# Patient Record
Sex: Female | Born: 1980 | Race: Black or African American | Hispanic: No | Marital: Single | Smoking: Never smoker
Health system: Southern US, Community
[De-identification: ages and names within clinical notes are randomized; demographics above are authoritative.]

## PROBLEM LIST (undated history)

## (undated) DIAGNOSIS — D649 Anemia, unspecified: Secondary | ICD-10-CM

## (undated) DIAGNOSIS — F4310 Post-traumatic stress disorder, unspecified: Secondary | ICD-10-CM

## (undated) DIAGNOSIS — K859 Acute pancreatitis without necrosis or infection, unspecified: Secondary | ICD-10-CM

## (undated) DIAGNOSIS — F419 Anxiety disorder, unspecified: Secondary | ICD-10-CM

## (undated) DIAGNOSIS — F329 Major depressive disorder, single episode, unspecified: Secondary | ICD-10-CM

## (undated) DIAGNOSIS — R87629 Unspecified abnormal cytological findings in specimens from vagina: Secondary | ICD-10-CM

## (undated) DIAGNOSIS — F32A Depression, unspecified: Secondary | ICD-10-CM

## (undated) DIAGNOSIS — R87619 Unspecified abnormal cytological findings in specimens from cervix uteri: Secondary | ICD-10-CM

## (undated) DIAGNOSIS — IMO0002 Reserved for concepts with insufficient information to code with codable children: Secondary | ICD-10-CM

## (undated) HISTORY — DX: Unspecified abnormal cytological findings in specimens from vagina: R87.629

## (undated) HISTORY — DX: Post-traumatic stress disorder, unspecified: F43.10

## (undated) HISTORY — PX: TONSILLECTOMY: SUR1361

## (undated) HISTORY — PX: CHOLECYSTECTOMY: SHX55

---
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
## 2007-04-12 ENCOUNTER — Emergency Department (HOSPITAL_COMMUNITY): Admission: EM | Admit: 2007-04-12 | Discharge: 2007-04-13 | Payer: Self-pay | Admitting: Emergency Medicine

## 2008-07-18 ENCOUNTER — Ambulatory Visit: Payer: Self-pay | Admitting: Physician Assistant

## 2008-07-18 ENCOUNTER — Inpatient Hospital Stay (HOSPITAL_COMMUNITY): Admission: AD | Admit: 2008-07-18 | Discharge: 2008-07-18 | Payer: Self-pay | Admitting: Obstetrics & Gynecology

## 2008-08-27 ENCOUNTER — Inpatient Hospital Stay (HOSPITAL_COMMUNITY): Admission: AD | Admit: 2008-08-27 | Discharge: 2008-08-27 | Payer: Self-pay | Admitting: Obstetrics & Gynecology

## 2008-08-27 ENCOUNTER — Ambulatory Visit: Payer: Self-pay | Admitting: Obstetrics and Gynecology

## 2008-09-28 ENCOUNTER — Inpatient Hospital Stay (HOSPITAL_COMMUNITY): Admission: AD | Admit: 2008-09-28 | Discharge: 2008-09-28 | Payer: Self-pay | Admitting: Obstetrics & Gynecology

## 2008-11-11 ENCOUNTER — Inpatient Hospital Stay (HOSPITAL_COMMUNITY): Admission: AD | Admit: 2008-11-11 | Discharge: 2008-11-11 | Payer: Self-pay | Admitting: Obstetrics & Gynecology

## 2008-11-11 ENCOUNTER — Ambulatory Visit: Payer: Self-pay | Admitting: Obstetrics and Gynecology

## 2008-12-07 ENCOUNTER — Ambulatory Visit: Payer: Self-pay | Admitting: Obstetrics & Gynecology

## 2008-12-07 ENCOUNTER — Encounter: Payer: Self-pay | Admitting: Obstetrics & Gynecology

## 2008-12-07 LAB — CONVERTED CEMR LAB
MCHC: 32.4 g/dL (ref 30.0–36.0)
Platelets: 307 10*3/uL (ref 150–400)
RDW: 13.9 % (ref 11.5–15.5)

## 2008-12-22 ENCOUNTER — Ambulatory Visit: Payer: Self-pay | Admitting: Obstetrics & Gynecology

## 2008-12-24 ENCOUNTER — Inpatient Hospital Stay (HOSPITAL_COMMUNITY): Admission: AD | Admit: 2008-12-24 | Discharge: 2008-12-24 | Payer: Self-pay | Admitting: Obstetrics and Gynecology

## 2009-01-16 ENCOUNTER — Inpatient Hospital Stay (HOSPITAL_COMMUNITY): Admission: AD | Admit: 2009-01-16 | Discharge: 2009-01-16 | Payer: Self-pay | Admitting: Obstetrics & Gynecology

## 2009-01-19 ENCOUNTER — Ambulatory Visit: Payer: Self-pay | Admitting: Obstetrics & Gynecology

## 2009-02-02 ENCOUNTER — Ambulatory Visit: Payer: Self-pay | Admitting: Obstetrics & Gynecology

## 2009-02-02 LAB — CONVERTED CEMR LAB: Chlamydia, DNA Probe: NEGATIVE

## 2009-02-03 ENCOUNTER — Encounter: Payer: Self-pay | Admitting: Obstetrics & Gynecology

## 2009-02-03 LAB — CONVERTED CEMR LAB: Trich, Wet Prep: NONE SEEN

## 2009-02-08 ENCOUNTER — Inpatient Hospital Stay (HOSPITAL_COMMUNITY): Admission: AD | Admit: 2009-02-08 | Discharge: 2009-02-08 | Payer: Self-pay | Admitting: Obstetrics & Gynecology

## 2009-02-08 ENCOUNTER — Ambulatory Visit: Payer: Self-pay | Admitting: Obstetrics and Gynecology

## 2009-02-16 ENCOUNTER — Ambulatory Visit: Payer: Self-pay | Admitting: Obstetrics & Gynecology

## 2009-02-16 LAB — CONVERTED CEMR LAB
RDW: 14.9 % (ref 11.5–15.5)
WBC: 7.7 10*3/uL (ref 4.0–10.5)

## 2009-02-19 ENCOUNTER — Inpatient Hospital Stay (HOSPITAL_COMMUNITY): Admission: AD | Admit: 2009-02-19 | Discharge: 2009-02-19 | Payer: Self-pay | Admitting: Obstetrics and Gynecology

## 2009-02-19 ENCOUNTER — Ambulatory Visit: Payer: Self-pay | Admitting: Obstetrics and Gynecology

## 2009-02-21 ENCOUNTER — Ambulatory Visit (HOSPITAL_COMMUNITY): Admission: RE | Admit: 2009-02-21 | Discharge: 2009-02-21 | Payer: Self-pay | Admitting: Family Medicine

## 2009-02-23 ENCOUNTER — Encounter: Payer: Self-pay | Admitting: Obstetrics & Gynecology

## 2009-02-23 ENCOUNTER — Ambulatory Visit: Payer: Self-pay | Admitting: Obstetrics & Gynecology

## 2009-03-02 ENCOUNTER — Ambulatory Visit: Payer: Self-pay | Admitting: Family Medicine

## 2009-03-06 ENCOUNTER — Ambulatory Visit: Payer: Self-pay | Admitting: Obstetrics & Gynecology

## 2009-03-07 ENCOUNTER — Inpatient Hospital Stay (HOSPITAL_COMMUNITY): Admission: AD | Admit: 2009-03-07 | Discharge: 2009-03-09 | Payer: Self-pay | Admitting: Obstetrics and Gynecology

## 2009-03-07 ENCOUNTER — Ambulatory Visit: Payer: Self-pay | Admitting: Advanced Practice Midwife

## 2009-06-19 ENCOUNTER — Inpatient Hospital Stay (HOSPITAL_COMMUNITY): Admission: AD | Admit: 2009-06-19 | Discharge: 2009-06-19 | Payer: Self-pay | Admitting: Obstetrics and Gynecology

## 2009-08-21 ENCOUNTER — Emergency Department (HOSPITAL_COMMUNITY): Admission: EM | Admit: 2009-08-21 | Discharge: 2009-08-21 | Payer: Self-pay | Admitting: Emergency Medicine

## 2009-08-23 ENCOUNTER — Emergency Department (HOSPITAL_COMMUNITY): Admission: EM | Admit: 2009-08-23 | Discharge: 2009-08-23 | Payer: Self-pay | Admitting: Emergency Medicine

## 2009-09-07 ENCOUNTER — Emergency Department (HOSPITAL_COMMUNITY): Admission: EM | Admit: 2009-09-07 | Discharge: 2009-09-08 | Payer: Self-pay | Admitting: Emergency Medicine

## 2009-09-11 ENCOUNTER — Ambulatory Visit: Payer: Self-pay | Admitting: Gastroenterology

## 2009-09-11 ENCOUNTER — Encounter (INDEPENDENT_AMBULATORY_CARE_PROVIDER_SITE_OTHER): Payer: Self-pay | Admitting: General Surgery

## 2009-09-11 ENCOUNTER — Observation Stay (HOSPITAL_COMMUNITY): Admission: EM | Admit: 2009-09-11 | Discharge: 2009-09-15 | Payer: Self-pay | Admitting: Emergency Medicine

## 2009-09-12 ENCOUNTER — Encounter: Payer: Self-pay | Admitting: Gastroenterology

## 2010-03-23 ENCOUNTER — Ambulatory Visit (HOSPITAL_COMMUNITY): Admission: RE | Admit: 2010-03-23 | Discharge: 2010-03-23 | Payer: Self-pay | Admitting: Psychiatry

## 2010-07-10 NOTE — Procedures (Signed)
Summary: ERCP  Patient: Diana Espinoza Note: All result statuses are Final unless otherwise noted.  Tests: (1) ERCP (ERC)   ERC ERCP                  DONE (C)     [facility]     [address] [place], [place]  [address]           ERCP PROCEDURE REPORT           PATIENT:  Diana, Espinoza  MR#:  [id number]     BIRTHDATE:  09/05/1980  GENDER:  female           ENDOSCOPIST:  Judie Petit T. Russella Dar, MD, [facility]           PROCEDURE DATE:  09/12/2009     PROCEDURE:  ERCP with sphincterotomy, and with removal of stones           INDICATIONS:  suspected stone, abnormal Liver Function Tests,     abnormal imaging-filling defect on IOC.           MEDICATIONS:  Fentanyl 125 mcg IV, Versed 12.5 mg IV, glucagon 1     mg IV     TOPICAL ANESTHETIC:  Cetacaine Spray           DESCRIPTION OF PROCEDURE:   After the risks benefits and     alternatives of the procedure were thoroughly explained, informed     consent was obtained.  The Pentax ED-3430T endoscope was     introduced through the mouth and advanced to the second portion of     the duodenum.           A filling defect was noted in the distal common bile duct. It was     3 - 4 mm in size. A stone extraction balloon was used to sweep the     duct and extract the stone/filling defect. No stone was noted     after 3 pull throughs and no filling defect noted on the last     occlusion cholangiogram. There was good bililary drainage noted.     The intrahepatic and extrahepatic bile ducts were otherwise     normal. Prior cholecystectomy. A normal appearing ampulla was     visualized. There was no evidence of papillitis or any trauma to     the ampulla. in the descending duodenum. Sphincterotomy was     performed with a regular 30 mm pappillotome using guidewire     technique. The scope was then completely withdrawn from the     patient and the procedure completed.     <<PROCEDUREIMAGES>>           COMPLICATIONS:  None        ENDOSCOPIC IMPRESSION:     1) 3 - 4 mm filling defect in the common bile duct     2) Otherwise normal biliary tree     3) Normal ampulla     4) Prior cholecystectomy           RECOMMENDATIONS:     1) liver enzymes in 4 weeks     2) follow-up: GI clinic PRN           Diedra Sinor T. Russella Dar, MD, [facility]           CC:  Claud Kelp, M.D.           n.     REVISED:  09/12/2009 10:27 AM  eSIGNED:   Clorine Swing T. Damyn Weitzel at 09/12/2009 10:27 AM           Damita Dunnings, [id number]  Note: An exclamation mark (!) indicates a result that was not dispersed into the flowsheet. Document Creation Date: 09/12/2009 10:28 AM _______________________________________________________________________  (1) Order result status: Final Collection or observation date-time: 09/12/2009 10:08 Requested date-time:  Receipt date-time:  Reported date-time:  Referring Physician:   Ordering Physician: Claudette Head [phone]) Specimen Source:  Source: Launa Grill Order Number: [id number] Lab site:

## 2010-08-26 LAB — URINALYSIS, ROUTINE W REFLEX MICROSCOPIC
Bilirubin Urine: NEGATIVE
Ketones, ur: NEGATIVE mg/dL
Leukocytes, UA: NEGATIVE
Nitrite: NEGATIVE
Protein, ur: NEGATIVE mg/dL
Specific Gravity, Urine: 1.025 (ref 1.005–1.030)

## 2010-08-26 LAB — URINE MICROSCOPIC-ADD ON

## 2010-08-29 LAB — COMPREHENSIVE METABOLIC PANEL
ALT: 62 U/L — ABNORMAL HIGH (ref 0–35)
ALT: 64 U/L — ABNORMAL HIGH (ref 0–35)
AST: 20 U/L (ref 0–37)
AST: 35 U/L (ref 0–37)
Albumin: 2.8 g/dL — ABNORMAL LOW (ref 3.5–5.2)
Albumin: 3.2 g/dL — ABNORMAL LOW (ref 3.5–5.2)
Albumin: 3.7 g/dL (ref 3.5–5.2)
Alkaline Phosphatase: 57 U/L (ref 39–117)
Alkaline Phosphatase: 62 U/L (ref 39–117)
Alkaline Phosphatase: 65 U/L (ref 39–117)
BUN: 2 mg/dL — ABNORMAL LOW (ref 6–23)
BUN: 4 mg/dL — ABNORMAL LOW (ref 6–23)
CO2: 26 mEq/L (ref 19–32)
CO2: 27 mEq/L (ref 19–32)
Calcium: 8.3 mg/dL — ABNORMAL LOW (ref 8.4–10.5)
Calcium: 9.1 mg/dL (ref 8.4–10.5)
Chloride: 101 mEq/L (ref 96–112)
Chloride: 111 mEq/L (ref 96–112)
Creatinine, Ser: 0.68 mg/dL (ref 0.4–1.2)
Creatinine, Ser: 0.78 mg/dL (ref 0.4–1.2)
GFR calc Af Amer: >60 mL/min (ref >60)
GFR calc Af Amer: >60 mL/min (ref >60)
GFR calc non Af Amer: >60 mL/min (ref >60)
GFR calc non Af Amer: >60 mL/min (ref >60)
GFR calc non Af Amer: >60 mL/min (ref >60)
Glucose, Bld: 97 mg/dL (ref 70–99)
Glucose, Bld: 99 mg/dL (ref 70–99)
Potassium: 3.2 mEq/L — ABNORMAL LOW (ref 3.5–5.1)
Potassium: 3.3 mEq/L — ABNORMAL LOW (ref 3.5–5.1)
Potassium: 3.4 mEq/L — ABNORMAL LOW (ref 3.5–5.1)
Sodium: 135 mEq/L (ref 135–145)
Sodium: 142 mEq/L (ref 135–145)
Total Bilirubin: 0.4 mg/dL (ref 0.3–1.2)
Total Bilirubin: 0.7 mg/dL (ref 0.3–1.2)
Total Protein: 6.3 g/dL (ref 6.0–8.3)
Total Protein: 7.2 g/dL (ref 6.0–8.3)

## 2010-08-29 LAB — AMYLASE
Amylase: 3562 U/L — ABNORMAL HIGH (ref 0–105)
Amylase: 456 U/L — ABNORMAL HIGH (ref 0–105)

## 2010-08-29 LAB — POCT PREGNANCY, URINE: Preg Test, Ur: POSITIVE

## 2010-08-29 LAB — CBC
HCT: 32.8 % — ABNORMAL LOW (ref 36.0–46.0)
HCT: 33.9 % — ABNORMAL LOW (ref 36.0–46.0)
Hemoglobin: 10.7 g/dL — ABNORMAL LOW (ref 12.0–15.0)
Hemoglobin: 11.1 g/dL — ABNORMAL LOW (ref 12.0–15.0)
Hemoglobin: 9.3 g/dL — ABNORMAL LOW (ref 12.0–15.0)
MCHC: 33 g/dL (ref 30.0–36.0)
MCHC: 33.7 g/dL (ref 30.0–36.0)
MCHC: 34 g/dL (ref 30.0–36.0)
MCV: 91.3 fL (ref 78.0–100.0)
MCV: 91.6 fL (ref 78.0–100.0)
MCV: 91.8 fL (ref 78.0–100.0)
Platelets: 224 10*3/uL (ref 150–400)
Platelets: 314 10*3/uL (ref 150–400)
RBC: 3.03 MIL/uL — ABNORMAL LOW (ref 3.87–5.11)
RDW: 15.4 % (ref 11.5–15.5)
WBC: 5.4 10*3/uL (ref 4.0–10.5)
WBC: 6.9 10*3/uL (ref 4.0–10.5)

## 2010-08-29 LAB — BASIC METABOLIC PANEL
CO2: 28 mEq/L (ref 19–32)
Calcium: 8.3 mg/dL — ABNORMAL LOW (ref 8.4–10.5)
Chloride: 108 mEq/L (ref 96–112)
GFR calc Af Amer: >60 mL/min (ref >60)
Sodium: 139 mEq/L (ref 135–145)

## 2010-08-29 LAB — URINALYSIS, ROUTINE W REFLEX MICROSCOPIC
Leukocytes, UA: NEGATIVE
Protein, ur: NEGATIVE mg/dL
pH: 6 (ref 5.0–8.0)

## 2010-08-29 LAB — DIFFERENTIAL
Basophils Absolute: 0 10*3/uL (ref 0.0–0.1)
Monocytes Relative: 6 % (ref 3–12)

## 2010-08-29 LAB — URINE MICROSCOPIC-ADD ON

## 2010-08-29 LAB — LIPASE, BLOOD: Lipase: 26 U/L (ref 11–59)

## 2010-08-29 LAB — HCG, QUANTITATIVE, PREGNANCY: hCG, Beta Chain, Quant, S: 59 m[IU]/mL — ABNORMAL HIGH (ref <5)

## 2010-09-03 LAB — CBC
HCT: 38.1 % (ref 36.0–46.0)
Hemoglobin: 11.7 g/dL — ABNORMAL LOW (ref 12.0–15.0)
Hemoglobin: 12.1 g/dL (ref 12.0–15.0)
RBC: 3.92 MIL/uL (ref 3.87–5.11)
RBC: 4.2 MIL/uL (ref 3.87–5.11)
WBC: 4.7 10*3/uL (ref 4.0–10.5)
WBC: 6.2 10*3/uL (ref 4.0–10.5)

## 2010-09-03 LAB — URINALYSIS, ROUTINE W REFLEX MICROSCOPIC
Glucose, UA: NEGATIVE mg/dL
Ketones, ur: NEGATIVE mg/dL
Nitrite: NEGATIVE
Specific Gravity, Urine: 1.012 (ref 1.005–1.030)
pH: 6.5 (ref 5.0–8.0)

## 2010-09-03 LAB — DIFFERENTIAL
Basophils Absolute: 0 10*3/uL (ref 0.0–0.1)
Basophils Relative: 0 % (ref 0–1)
Basophils Relative: 0 % (ref 0–1)
Eosinophils Absolute: 0 10*3/uL (ref 0.0–0.7)
Eosinophils Absolute: 0 10*3/uL (ref 0.0–0.7)
Eosinophils Relative: 1 % (ref 0–5)
Lymphs Abs: 2.4 10*3/uL (ref 0.7–4.0)
Monocytes Absolute: 0.3 10*3/uL (ref 0.1–1.0)
Monocytes Relative: 6 % (ref 3–12)
Neutro Abs: 3.1 10*3/uL (ref 1.7–7.7)
Neutrophils Relative %: 66 % (ref 43–77)

## 2010-09-03 LAB — COMPREHENSIVE METABOLIC PANEL
ALT: 20 U/L (ref 0–35)
ALT: 42 U/L — ABNORMAL HIGH (ref 0–35)
AST: 47 U/L — ABNORMAL HIGH (ref 0–37)
Albumin: 3.7 g/dL (ref 3.5–5.2)
Alkaline Phosphatase: 57 U/L (ref 39–117)
Alkaline Phosphatase: 66 U/L (ref 39–117)
BUN: 10 mg/dL (ref 6–23)
BUN: 8 mg/dL (ref 6–23)
CO2: 22 mEq/L (ref 19–32)
Calcium: 9 mg/dL (ref 8.4–10.5)
Chloride: 105 mEq/L (ref 96–112)
Chloride: 105 mEq/L (ref 96–112)
Creatinine, Ser: 0.64 mg/dL (ref 0.4–1.2)
GFR calc Af Amer: >60 mL/min (ref >60)
GFR calc non Af Amer: >60 mL/min (ref >60)
Glucose, Bld: 101 mg/dL — ABNORMAL HIGH (ref 70–99)
Glucose, Bld: 83 mg/dL (ref 70–99)
Potassium: 3.2 mEq/L — ABNORMAL LOW (ref 3.5–5.1)
Potassium: 4 mEq/L (ref 3.5–5.1)
Sodium: 136 mEq/L (ref 135–145)
Sodium: 137 mEq/L (ref 135–145)
Total Bilirubin: 0.5 mg/dL (ref 0.3–1.2)
Total Bilirubin: 0.6 mg/dL (ref 0.3–1.2)
Total Protein: 7.3 g/dL (ref 6.0–8.3)
Total Protein: 7.6 g/dL (ref 6.0–8.3)

## 2010-09-03 LAB — LIPASE, BLOOD: Lipase: 29 U/L (ref 11–59)

## 2010-09-03 LAB — URINE CULTURE: Colony Count: >=100000

## 2010-09-03 LAB — URINE MICROSCOPIC-ADD ON

## 2010-09-03 LAB — HEMOCCULT GUIAC POC 1CARD (OFFICE): Fecal Occult Bld: NEGATIVE

## 2010-09-03 LAB — WET PREP, GENITAL: WBC, Wet Prep HPF POC: NONE SEEN

## 2010-09-14 LAB — CBC
MCHC: 33.3 g/dL (ref 30.0–36.0)
RBC: 3.14 MIL/uL — ABNORMAL LOW (ref 3.87–5.11)
RDW: 15.8 % — ABNORMAL HIGH (ref 11.5–15.5)

## 2010-09-14 LAB — RPR: RPR Ser Ql: NONREACTIVE

## 2010-09-14 LAB — POCT URINALYSIS DIP (DEVICE)
Glucose, UA: NEGATIVE mg/dL
Glucose, UA: NEGATIVE mg/dL
Hgb urine dipstick: NEGATIVE
Nitrite: NEGATIVE
Nitrite: NEGATIVE
Protein, ur: 30 mg/dL — AB
Specific Gravity, Urine: 1.02 (ref 1.005–1.030)
Urobilinogen, UA: 0.2 mg/dL (ref 0.0–1.0)
Urobilinogen, UA: 1 mg/dL (ref 0.0–1.0)
Urobilinogen, UA: 0.2 mg/dL (ref 0.0–1.0)
pH: 8 (ref 5.0–8.0)

## 2010-09-15 LAB — CBC
MCHC: 33.6 g/dL (ref 30.0–36.0)
Platelets: 302 10*3/uL (ref 150–400)
RBC: 3.07 MIL/uL — ABNORMAL LOW (ref 3.87–5.11)
WBC: 10 10*3/uL (ref 4.0–10.5)

## 2010-09-15 LAB — POCT URINALYSIS DIP (DEVICE)
Bilirubin Urine: NEGATIVE
Bilirubin Urine: NEGATIVE
Glucose, UA: NEGATIVE mg/dL
Glucose, UA: NEGATIVE mg/dL
Hgb urine dipstick: NEGATIVE
Nitrite: NEGATIVE
Nitrite: NEGATIVE

## 2010-09-15 LAB — URINALYSIS, ROUTINE W REFLEX MICROSCOPIC
Bilirubin Urine: NEGATIVE
Nitrite: NEGATIVE
Specific Gravity, Urine: 1.02 (ref 1.005–1.030)
Urobilinogen, UA: 0.2 mg/dL (ref 0.0–1.0)

## 2010-09-16 LAB — POCT URINALYSIS DIP (DEVICE)
Bilirubin Urine: NEGATIVE
Glucose, UA: NEGATIVE mg/dL
Hgb urine dipstick: NEGATIVE
Nitrite: NEGATIVE

## 2010-09-16 LAB — GONOCOCCUS DNA, PCR: GC Probe Amp, Genital: NEGATIVE

## 2010-09-17 LAB — URINALYSIS, ROUTINE W REFLEX MICROSCOPIC
Nitrite: NEGATIVE
Specific Gravity, Urine: 1.015 (ref 1.005–1.030)
Urobilinogen, UA: 0.2 mg/dL (ref 0.0–1.0)
pH: 8 (ref 5.0–8.0)

## 2010-09-17 LAB — POCT URINALYSIS DIP (DEVICE)
Bilirubin Urine: NEGATIVE
Glucose, UA: NEGATIVE mg/dL
Ketones, ur: NEGATIVE mg/dL
pH: 7.5 (ref 5.0–8.0)

## 2010-09-20 LAB — URINALYSIS, ROUTINE W REFLEX MICROSCOPIC
Nitrite: NEGATIVE
Protein, ur: NEGATIVE mg/dL
Specific Gravity, Urine: 1.025 (ref 1.005–1.030)
Urobilinogen, UA: 0.2 mg/dL (ref 0.0–1.0)

## 2010-09-20 LAB — GC/CHLAMYDIA PROBE AMP, URINE: Chlamydia, Swab/Urine, PCR: NEGATIVE

## 2010-09-20 LAB — URINE MICROSCOPIC-ADD ON

## 2010-09-25 LAB — URINALYSIS, ROUTINE W REFLEX MICROSCOPIC
Bilirubin Urine: NEGATIVE
Hgb urine dipstick: NEGATIVE
Nitrite: NEGATIVE
Specific Gravity, Urine: 1.02 (ref 1.005–1.030)
Urobilinogen, UA: 0.2 mg/dL (ref 0.0–1.0)
pH: 7.5 (ref 5.0–8.0)

## 2010-09-25 LAB — ABO/RH: ABO/RH(D): O POS

## 2010-09-25 LAB — HCG, QUANTITATIVE, PREGNANCY: hCG, Beta Chain, Quant, S: 101189 m[IU]/mL — ABNORMAL HIGH (ref <5)

## 2010-09-25 LAB — WET PREP, GENITAL: Yeast Wet Prep HPF POC: NONE SEEN

## 2010-09-25 LAB — POCT PREGNANCY, URINE: Preg Test, Ur: POSITIVE

## 2010-11-06 ENCOUNTER — Inpatient Hospital Stay (HOSPITAL_COMMUNITY)
Admission: AD | Admit: 2010-11-06 | Discharge: 2010-11-06 | Disposition: A | Discharge status: Home / Self Care | Payer: 59 | Source: Ambulatory Visit | Attending: Family Medicine | Admitting: Family Medicine

## 2010-11-06 DIAGNOSIS — R300 Dysuria: Secondary | ICD-10-CM

## 2010-11-06 DIAGNOSIS — N390 Urinary tract infection, site not specified: Secondary | ICD-10-CM

## 2010-11-06 LAB — URINALYSIS, ROUTINE W REFLEX MICROSCOPIC
Bilirubin Urine: NEGATIVE
Glucose, UA: NEGATIVE mg/dL
Ketones, ur: NEGATIVE mg/dL
pH: 6.5 (ref 5.0–8.0)

## 2010-11-06 LAB — URINE MICROSCOPIC-ADD ON

## 2010-11-06 LAB — POCT PREGNANCY, URINE: Preg Test, Ur: NEGATIVE

## 2010-11-09 LAB — URINE CULTURE

## 2011-01-20 ENCOUNTER — Emergency Department (HOSPITAL_COMMUNITY)
Admission: EM | Admit: 2011-01-20 | Discharge: 2011-01-20 | Disposition: A | Discharge status: Home / Self Care | Payer: 59 | Attending: Emergency Medicine | Admitting: Emergency Medicine

## 2011-01-20 DIAGNOSIS — R112 Nausea with vomiting, unspecified: Secondary | ICD-10-CM | POA: Insufficient documentation

## 2011-01-20 DIAGNOSIS — R197 Diarrhea, unspecified: Secondary | ICD-10-CM | POA: Insufficient documentation

## 2011-01-20 DIAGNOSIS — R1013 Epigastric pain: Secondary | ICD-10-CM | POA: Insufficient documentation

## 2011-01-20 LAB — COMPREHENSIVE METABOLIC PANEL
AST: 14 U/L (ref 0–37)
Albumin: 3.9 g/dL (ref 3.5–5.2)
BUN: 12 mg/dL (ref 6–23)
Chloride: 103 mEq/L (ref 96–112)
Creatinine, Ser: 0.58 mg/dL (ref 0.50–1.10)
Total Protein: 8 g/dL (ref 6.0–8.3)

## 2011-01-20 LAB — URINALYSIS, ROUTINE W REFLEX MICROSCOPIC
Glucose, UA: NEGATIVE mg/dL
Ketones, ur: NEGATIVE mg/dL
Leukocytes, UA: NEGATIVE
Protein, ur: NEGATIVE mg/dL
pH: 6 (ref 5.0–8.0)

## 2011-01-20 LAB — DIFFERENTIAL
Lymphocytes Relative: 34 % (ref 12–46)
Monocytes Absolute: 0.3 10*3/uL (ref 0.1–1.0)
Monocytes Relative: 7 % (ref 3–12)
Neutro Abs: 2.6 10*3/uL (ref 1.7–7.7)
Neutrophils Relative %: 58 % (ref 43–77)

## 2011-01-20 LAB — URINE MICROSCOPIC-ADD ON

## 2011-01-20 LAB — CBC
HCT: 38.3 % (ref 36.0–46.0)
Hemoglobin: 12.5 g/dL (ref 12.0–15.0)
MCH: 29.7 pg (ref 26.0–34.0)
MCHC: 32.6 g/dL (ref 30.0–36.0)
RBC: 4.21 MIL/uL (ref 3.87–5.11)

## 2011-01-20 LAB — LIPASE, BLOOD: Lipase: 21 U/L (ref 11–59)

## 2011-01-21 IMAGING — RF DG ERCP WO/W SPHINCTEROTOMY
14 of 16 series · 14 of 18 positions shown · non-contrast
Comparison: Intraoperative cholangiogram dated 09/11/2009

CLINICAL DATA: Symptomatic cholelithiasis.  Recent cholecystectomy.

ERCP
TECHNIQUE: Multiple spot images obtained with the fluoroscopic
device and submitted for interpretation post-procedure.  ERCP was
performed by Dr. Thembu.

[Series 2: cont. · 1 of 1 slices shown (1 of 14)]
[im 1/1]
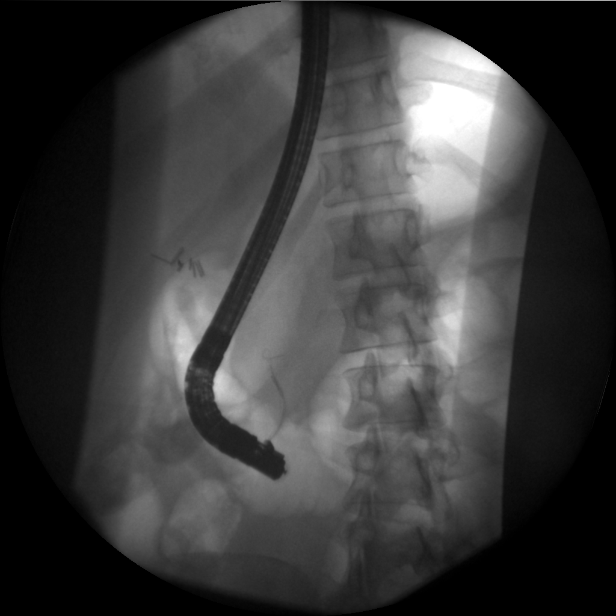

[Series 3: cont. · 1 of 1 slices shown (2 of 14)]
[im 1/1]
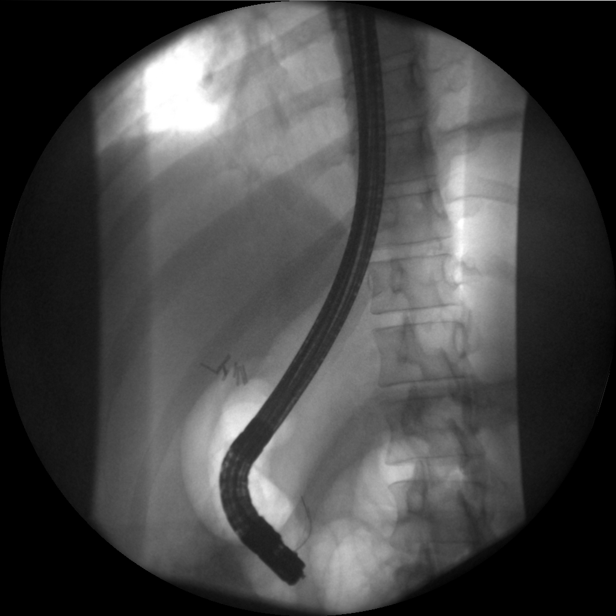

[Series 4: cont. · 1 of 2 slices shown (3 of 14)]
[im 2/2]
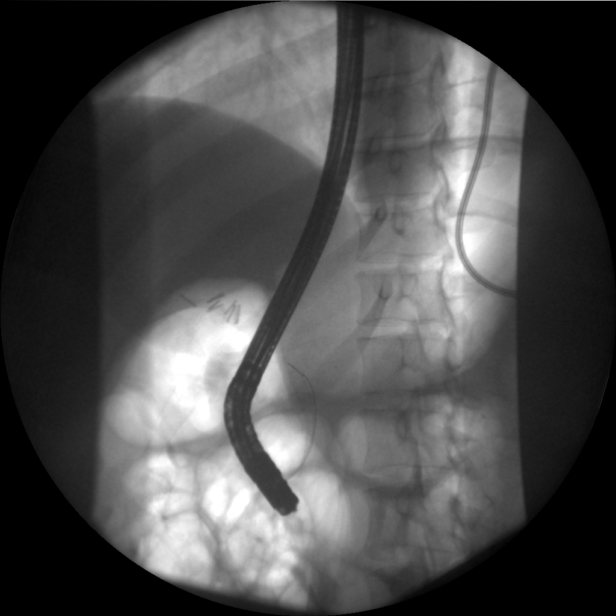

[Series 6: cont. · 1 of 1 slices shown (4 of 14)]
[im 1/1]
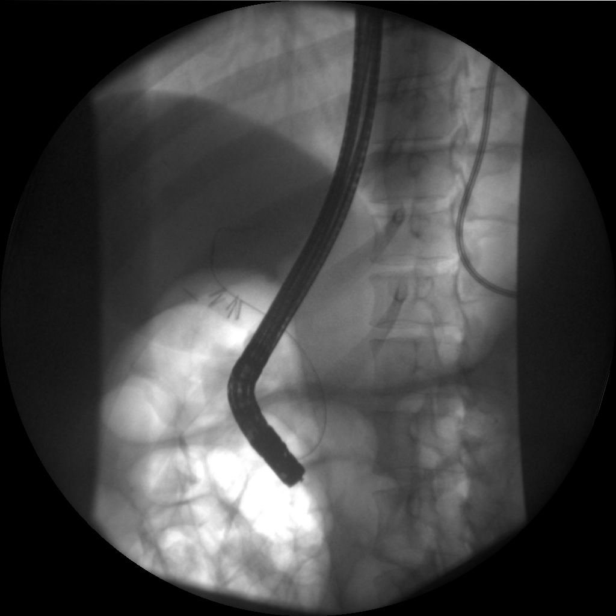

[Series 7: cont. · 1 of 1 slices shown (5 of 14)]
[im 1/1]
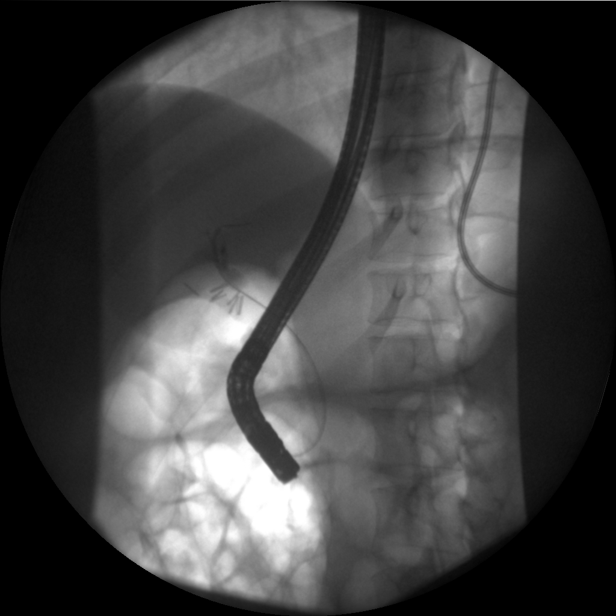

[Series 9: cont. · 1 of 1 slices shown (6 of 14)]
[im 1/1]
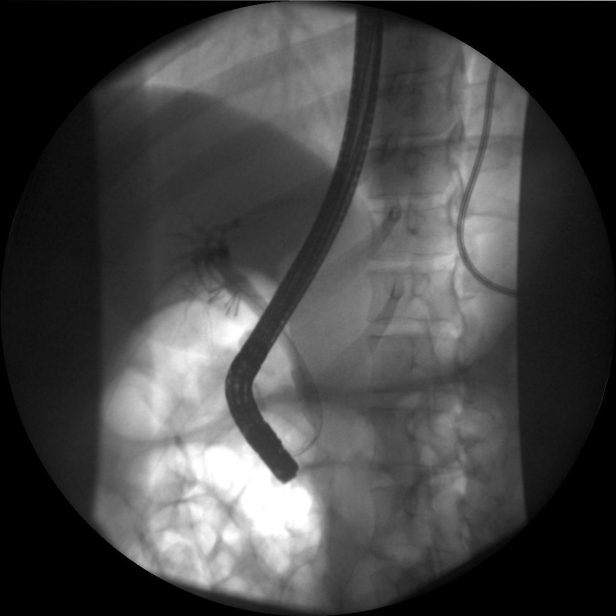

[Series 10: cont. · 1 of 1 slices shown (7 of 14)]
[im 1/1]
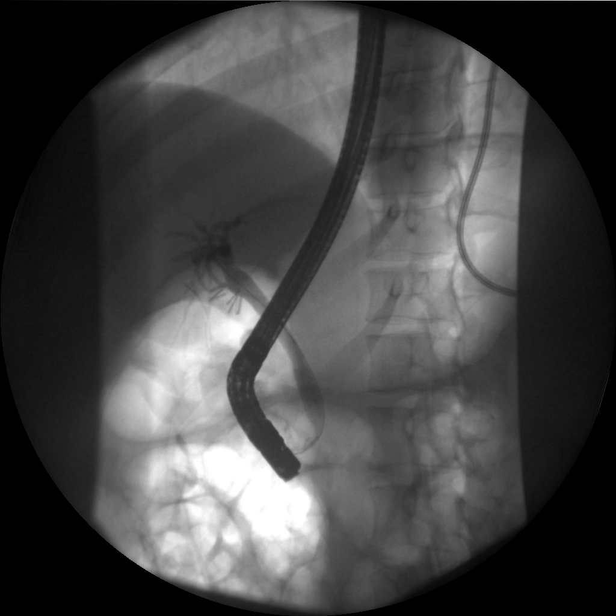

[Series 11: cont. · 1 of 1 slices shown (8 of 14)]
[im 1/1]
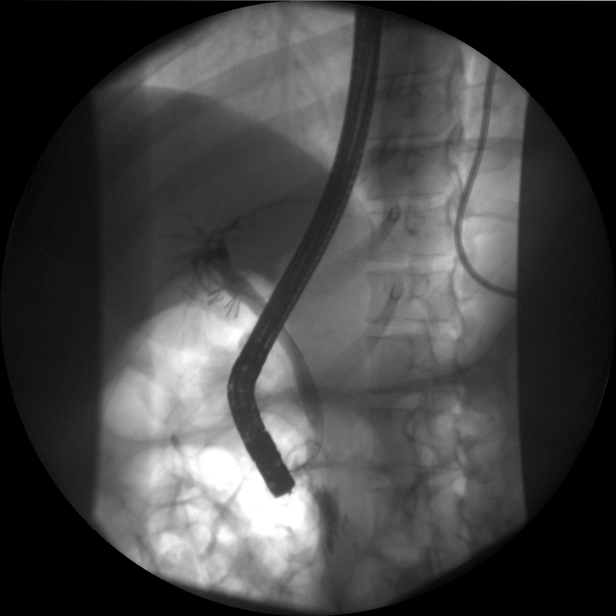

[Series 13: cont. · 1 of 1 slices shown (9 of 14)]
[im 1/1]
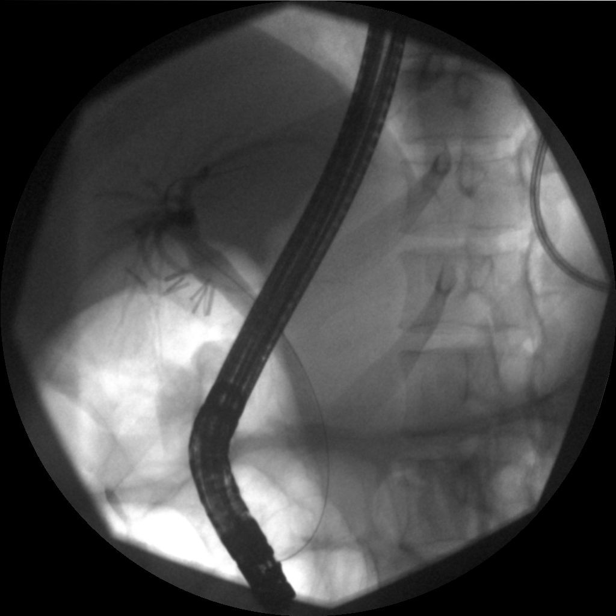

[Series 14: cont. · 1 of 2 slices shown (10 of 14)]
[im 2/2]
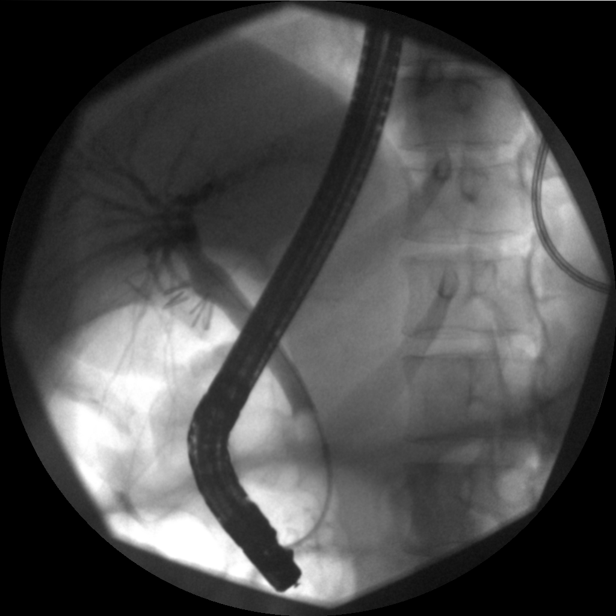

[Series 15: cont. · 1 of 1 slices shown (11 of 14)]
[im 1/1]
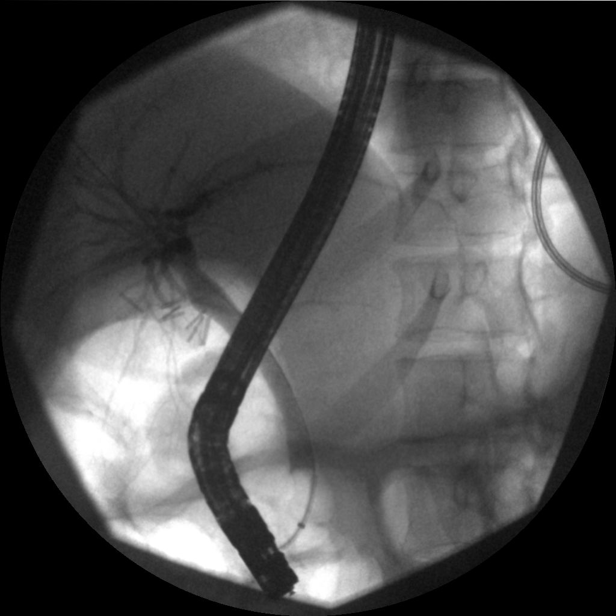

[Series 18: cont. · 1 of 1 slices shown (12 of 14)]
[im 1/1]
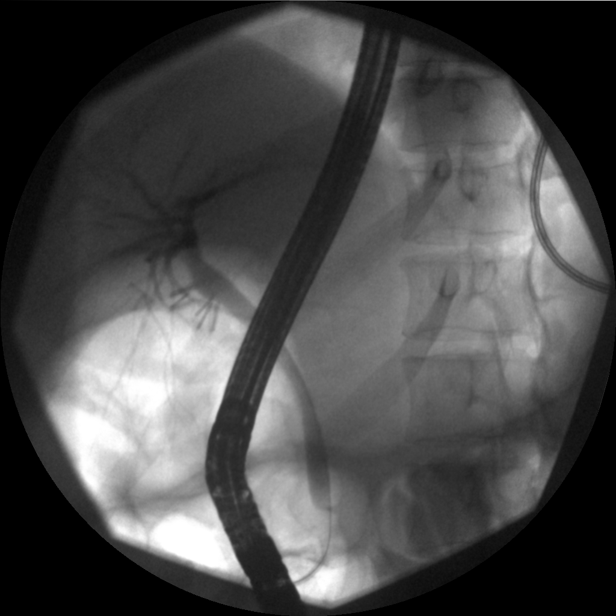

[Series 21: cont. · 1 of 1 slices shown (13 of 14)]
[im 1/1]
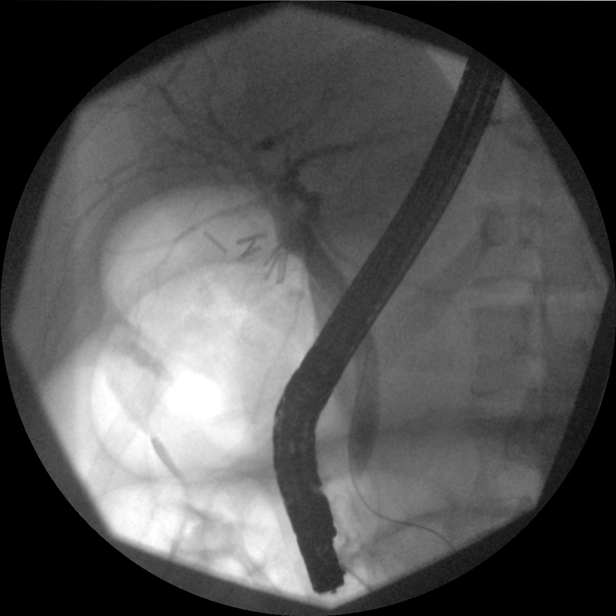

[Series 22: cont. · 1 of 1 slices shown (14 of 14)]
[im 1/1]
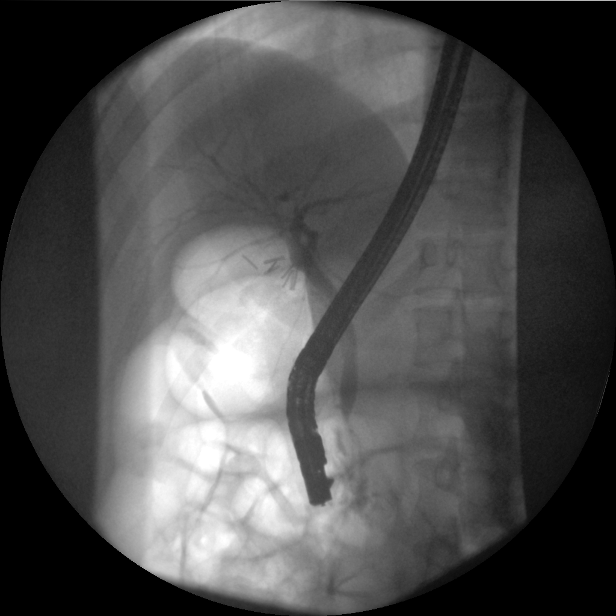

[14 of 18 positions shown; findings below may reference images not displayed]

FINDINGS: Cannulization of the common bile duct with opacification
of the biliary system.  Minimal opacification of the intrahepatic
bile ducts.  A balloon was pulled through the common bile duct.  On
series #11, there is a questionable filling defect near the distal
common bile duct.  There are no large filling defects on the final
image.
IMPRESSION: Nondilated biliary system.  Question a small filling defect in the
distal common bile duct that was removed or resolved.

These images were submitted for radiologic interpretation only.
Please see the procedural report for the amount of contrast and the
fluoroscopy time utilized.

## 2011-03-19 LAB — I-STAT 8, (EC8 V) (CONVERTED LAB)
Acid-Base Excess: 1
BUN: 9
Bicarbonate: 27.6 — ABNORMAL HIGH
Chloride: 103
Glucose, Bld: 87
HCT: 37
Hemoglobin: 12.6
Operator id: 272551
Potassium: 4.1
Sodium: 137
TCO2: 29
pCO2, Ven: 49.8
pH, Ven: 7.352 — ABNORMAL HIGH

## 2011-03-19 LAB — DIFFERENTIAL
Eosinophils Absolute: 0.1
Eosinophils Relative: 1
Lymphocytes Relative: 35
Lymphs Abs: 2.9
Monocytes Absolute: 0.8 — ABNORMAL HIGH
Monocytes Relative: 9

## 2011-03-19 LAB — URINALYSIS, ROUTINE W REFLEX MICROSCOPIC
Bilirubin Urine: NEGATIVE
Glucose, UA: NEGATIVE
Nitrite: NEGATIVE
Specific Gravity, Urine: 1.028
pH: 6

## 2011-03-19 LAB — WET PREP, GENITAL
Clue Cells Wet Prep HPF POC: NONE SEEN
Yeast Wet Prep HPF POC: NONE SEEN

## 2011-03-19 LAB — RPR: RPR Ser Ql: NONREACTIVE

## 2011-03-19 LAB — CBC
HCT: 34.3 — ABNORMAL LOW
Hemoglobin: 11.5 — ABNORMAL LOW
MCV: 90.2
RBC: 3.8 — ABNORMAL LOW
WBC: 8.4

## 2011-03-19 LAB — POCT I-STAT CREATININE
Creatinine, Ser: 0.7
Operator id: 272551

## 2011-03-19 LAB — POCT PREGNANCY, URINE: Operator id: 272551

## 2011-03-19 LAB — HCG, QUANTITATIVE, PREGNANCY: hCG, Beta Chain, Quant, S: 145645 — ABNORMAL HIGH

## 2011-03-19 LAB — ABO/RH: ABO/RH(D): O POS

## 2011-07-16 ENCOUNTER — Encounter (HOSPITAL_COMMUNITY): Payer: Self-pay | Admitting: *Deleted

## 2011-07-16 ENCOUNTER — Inpatient Hospital Stay (HOSPITAL_COMMUNITY)
Admission: AD | Admit: 2011-07-16 | Discharge: 2011-07-16 | Disposition: A | Discharge status: Home / Self Care | Payer: 59 | Source: Ambulatory Visit | Attending: Obstetrics & Gynecology | Admitting: Obstetrics & Gynecology

## 2011-07-16 DIAGNOSIS — R350 Frequency of micturition: Secondary | ICD-10-CM | POA: Insufficient documentation

## 2011-07-16 DIAGNOSIS — N390 Urinary tract infection, site not specified: Secondary | ICD-10-CM | POA: Insufficient documentation

## 2011-07-16 HISTORY — DX: Major depressive disorder, single episode, unspecified: F32.9

## 2011-07-16 HISTORY — DX: Anxiety disorder, unspecified: F41.9

## 2011-07-16 HISTORY — DX: Depression, unspecified: F32.A

## 2011-07-16 HISTORY — DX: Unspecified abnormal cytological findings in specimens from cervix uteri: R87.619

## 2011-07-16 HISTORY — DX: Reserved for concepts with insufficient information to code with codable children: IMO0002

## 2011-07-16 LAB — URINE MICROSCOPIC-ADD ON

## 2011-07-16 LAB — URINALYSIS, ROUTINE W REFLEX MICROSCOPIC
Protein, ur: >300 mg/dL — AB
Urobilinogen, UA: >8 mg/dL — ABNORMAL HIGH (ref 0.0–1.0)

## 2011-07-16 MED ORDER — CIPROFLOXACIN HCL 500 MG PO TABS: 500.0000 mg | ORAL_TABLET | Freq: Two times a day (BID) | ORAL | Status: AC

## 2011-07-16 NOTE — Progress Notes (Signed)
Patient states she thinks she has a UTI. She feels like she has to urinate a lot. No burning when she urinates. Was treated for one in Dec.

## 2011-07-16 NOTE — ED Provider Notes (Signed)
History     Chief Complaint  Patient presents with  . Urinary Tract Infection   HPI 31 y.o. [id number] with c/o urinary frequency, no pain. Thinks she may have a UTI.     No past medical history on file.  No past surgical history on file.  No family history on file.  History  Substance Use Topics  . Smoking status: Not on file  . Smokeless tobacco: Not on file  . Alcohol Use: Not on file    Allergies: Allergies not on file  No prescriptions prior to admission    Review of Systems  Constitutional: Negative.   Respiratory: Negative.   Cardiovascular: Negative.   Gastrointestinal: Negative for nausea, vomiting, abdominal pain, diarrhea and constipation.  Genitourinary: Positive for frequency. Negative for dysuria, urgency, hematuria and flank pain.       Negative for vaginal bleeding, vaginal discharge  Musculoskeletal: Negative.   Neurological: Negative.   Psychiatric/Behavioral: Negative.    Physical Exam   Blood pressure 119/62, pulse 70, temperature 98.1 F (36.7 C), temperature source Oral, resp. rate 18, height 5\' 4"  (1.626 m), weight 159 lb 8 oz (72.349 kg).  Physical Exam  Nursing note and vitals reviewed. Constitutional: She is oriented to person, place, and time. She appears well-developed and well-nourished. No distress.  Cardiovascular: Normal rate.   Respiratory: Effort normal.  GI: Soft. There is no tenderness.  Musculoskeletal: Normal range of motion.  Neurological: She is oriented to person, place, and time.  Skin: Skin is warm and dry.  Psychiatric: She has a normal mood and affect.    MAU Course  Procedures  Results for orders placed during the hospital encounter of 07/16/11 (from the past 24 hour(s))  URINALYSIS, ROUTINE W REFLEX MICROSCOPIC     Status: Abnormal   Collection Time   07/16/11  8:40 PM      Component Value Range   Color, Urine ORANGE (*) YELLOW    APPearance CLOUDY (*) CLEAR    Specific Gravity, Urine 1.020  1.005 - 1.030      pH 5.0  5.0 - 8.0    Glucose, UA 250 (*) NEGATIVE (mg/dL)   Hgb urine dipstick LARGE (*) NEGATIVE    Bilirubin Urine SMALL (*) NEGATIVE    Ketones, ur 15 (*) NEGATIVE (mg/dL)   Protein, ur >621 (*) NEGATIVE (mg/dL)   Urobilinogen, UA >3.0 (*) 0.0 - 1.0 (mg/dL)   Nitrite POSITIVE (*) NEGATIVE    Leukocytes, UA MODERATE (*) NEGATIVE   URINE MICROSCOPIC-ADD ON     Status: Abnormal   Collection Time   07/16/11  8:40 PM      Component Value Range   Squamous Epithelial / LPF MANY (*) RARE    WBC, UA 21-50  <3 (WBC/hpf)   RBC / HPF 21-50  <3 (RBC/hpf)   Bacteria, UA FEW (*) RARE   POCT PREGNANCY, URINE     Status: Normal   Collection Time   07/16/11  8:45 PM      Component Value Range   Preg Test, Ur NEGATIVE  NEGATIVE      Assessment and Plan  UTI - rx Cipro, continue Pyridium as desired F/U PRN  Myldred Raju 07/16/2011, 8:51 PM

## 2011-08-02 ENCOUNTER — Telehealth: Payer: Self-pay | Admitting: Advanced Practice Midwife

## 2011-08-04 NOTE — Telephone Encounter (Signed)
See telephone note.

## 2011-09-20 ENCOUNTER — Encounter: Payer: Self-pay | Admitting: Gastroenterology

## 2011-10-14 ENCOUNTER — Ambulatory Visit: Payer: 59 | Admitting: Gastroenterology

## 2012-05-05 ENCOUNTER — Ambulatory Visit: Payer: Self-pay

## 2012-05-06 ENCOUNTER — Emergency Department (HOSPITAL_COMMUNITY)
Admission: EM | Admit: 2012-05-06 | Discharge: 2012-05-06 | Disposition: A | Discharge status: Home / Self Care | Payer: Self-pay | Attending: Emergency Medicine | Admitting: Emergency Medicine

## 2012-05-06 ENCOUNTER — Encounter (HOSPITAL_COMMUNITY): Payer: Self-pay | Admitting: Emergency Medicine

## 2012-05-06 DIAGNOSIS — Z8659 Personal history of other mental and behavioral disorders: Secondary | ICD-10-CM | POA: Insufficient documentation

## 2012-05-06 DIAGNOSIS — K047 Periapical abscess without sinus: Secondary | ICD-10-CM | POA: Insufficient documentation

## 2012-05-06 DIAGNOSIS — K859 Acute pancreatitis without necrosis or infection, unspecified: Secondary | ICD-10-CM | POA: Insufficient documentation

## 2012-05-06 HISTORY — DX: Acute pancreatitis without necrosis or infection, unspecified: K85.90

## 2012-05-06 MED ORDER — TRAMADOL HCL 50 MG PO TABS
50.0000 mg | ORAL_TABLET | Freq: Once | ORAL | Status: AC
  Administered 2012-05-06: 50 mg via ORAL
  Filled 2012-05-06: qty 1

## 2012-05-06 MED ORDER — AMOXICILLIN 500 MG PO CAPS: 500.0000 mg | ORAL_CAPSULE | Freq: Three times a day (TID) | ORAL | Status: DC

## 2012-05-06 MED ORDER — AMOXICILLIN 500 MG PO CAPS
500.0000 mg | ORAL_CAPSULE | Freq: Once | ORAL | Status: AC
  Administered 2012-05-06: 500 mg via ORAL
  Filled 2012-05-06: qty 1

## 2012-05-06 MED ORDER — TRAMADOL HCL 50 MG PO TABS: 50.0000 mg | ORAL_TABLET | Freq: Four times a day (QID) | ORAL | Status: DC | PRN

## 2012-05-06 NOTE — ED Provider Notes (Signed)
History     CSN: [id number]  Arrival date & time 05/06/12  2001   First MD Initiated Contact with Patient 05/06/12 2012      Chief Complaint  Patient presents with  . Dental Pain    (Consider location/radiation/quality/duration/timing/severity/associated sxs/prior treatment) HPI Comments: Diana Espinoza  presents with a 2 day history of dental pain and gingival swelling.   The patient has a history of injury to the tooth which fractured further 2 days ago now with increased pain and swelling.  There has been no fevers,  Chills, nausea or vomiting, also no complaint of difficulty swallowing,  Although chewing makes pain worse.  The patient has tried aleve and bc powders without relief of symptoms.      The history is provided by the patient.    Past Medical History  Diagnosis Date  . Abnormal Pap smear   . Depression   . Anxiety     on meds sometimes. Coping effectively  . Pancreatitis     Past Surgical History  Procedure Date  . Ectopic pregnancy surgery 2000  . Tonsillectomy   . Cholecystectomy     Family History  Problem Relation Age of Onset  . Anesthesia problems Neg Hx     History  Substance Use Topics  . Smoking status: Never Smoker   . Smokeless tobacco: Not on file  . Alcohol Use: Yes    OB History    Grav Para Term Preterm Abortions TAB SAB Ect Mult Living   5    2 1  1  3       Review of Systems  Constitutional: Negative for fever.  HENT: Positive for dental problem. Negative for sore throat, facial swelling, neck pain and neck stiffness.   Respiratory: Negative for shortness of breath.     Allergies  Review of patient's allergies indicates no known allergies.  Home Medications   Current Outpatient Rx  Name  Route  Sig  Dispense  Refill  . BC HEADACHE POWDER PO   Oral   Take 1 packet by mouth daily as needed. For headaches         . NAPROXEN SODIUM 220 MG PO TABS   Oral   Take 220 mg by mouth 2 (two) times daily with a meal.           . AMOXICILLIN 500 MG PO CAPS   Oral   Take 1 capsule (500 mg total) by mouth 3 (three) times daily.   21 capsule   0   . TRAMADOL HCL 50 MG PO TABS   Oral   Take 1 tablet (50 mg total) by mouth every 6 (six) hours as needed for pain.   15 tablet   0     BP 102/75  Pulse 72  Temp 98.6 F (37 C) (Oral)  Resp 18  SpO2 100%  LMP 04/27/2012  Physical Exam  Constitutional: She is oriented to person, place, and time. She appears well-developed and well-nourished. No distress.  HENT:  Head: Normocephalic and atraumatic.  Right Ear: Tympanic membrane and external ear normal.  Left Ear: Tympanic membrane and external ear normal.  Mouth/Throat: Oropharynx is clear and moist and mucous membranes are normal. No oral lesions. Dental abscesses present.    Eyes: Conjunctivae normal are normal.  Neck: Normal range of motion. Neck supple.  Cardiovascular: Normal rate and normal heart sounds.   Pulmonary/Chest: Effort normal.  Abdominal: She exhibits no distension.  Musculoskeletal: Normal range of motion.  Lymphadenopathy:    She has no cervical adenopathy.  Neurological: She is alert and oriented to person, place, and time.  Skin: Skin is warm and dry. No erythema.  Psychiatric: She has a normal mood and affect.    ED Course  Procedures (including critical care time)  Labs Reviewed - No data to display No results found.   1. Dental abscess       MDM  Amoxil, tramadol prescribed.  Pt has a Education officer, community at [employer] whom she is planning to see next week for definitive care.          Burgess Amor, [place] 05/06/12 2042

## 2012-05-06 NOTE — ED Notes (Signed)
Patient complaining of left sided dental pain for the past two days; reports chipping her wisdom tooth on the left side while eating.  Denies swelling and numbness.

## 2012-05-07 NOTE — ED Provider Notes (Signed)
Medical screening examination/treatment/procedure(s) were performed by non-physician practitioner and as supervising physician I was immediately available for consultation/collaboration.   Glynn Octave, MD 05/07/12 [phone]

## 2013-04-19 ENCOUNTER — Emergency Department (HOSPITAL_COMMUNITY)
Admission: EM | Admit: 2013-04-19 | Discharge: 2013-04-19 | Disposition: A | Discharge status: Home / Self Care | Payer: 59 | Attending: Emergency Medicine | Admitting: Emergency Medicine

## 2013-04-19 ENCOUNTER — Encounter (HOSPITAL_COMMUNITY): Payer: Self-pay | Admitting: Emergency Medicine

## 2013-04-19 DIAGNOSIS — F411 Generalized anxiety disorder: Secondary | ICD-10-CM | POA: Insufficient documentation

## 2013-04-19 DIAGNOSIS — F3289 Other specified depressive episodes: Secondary | ICD-10-CM | POA: Insufficient documentation

## 2013-04-19 DIAGNOSIS — Z8719 Personal history of other diseases of the digestive system: Secondary | ICD-10-CM | POA: Insufficient documentation

## 2013-04-19 DIAGNOSIS — H9319 Tinnitus, unspecified ear: Secondary | ICD-10-CM | POA: Insufficient documentation

## 2013-04-19 DIAGNOSIS — J029 Acute pharyngitis, unspecified: Secondary | ICD-10-CM | POA: Insufficient documentation

## 2013-04-19 DIAGNOSIS — F329 Major depressive disorder, single episode, unspecified: Secondary | ICD-10-CM | POA: Insufficient documentation

## 2013-04-19 MED ORDER — HYDROCODONE-ACETAMINOPHEN 7.5-325 MG/15ML PO SOLN
15.0000 mL | Freq: Three times a day (TID) | ORAL | Status: DC | PRN
Start: 2013-04-19 — End: 2013-04-25

## 2013-04-19 NOTE — Discharge Instructions (Signed)
Recommend you followup with a primary care provider. Followup at the [facility] if you do not have a primary care provider. Recommend salt water gargles for symptoms. You may take Hycet as needed for sore throat. Return to the emergency department if symptoms worsen.  Pharyngitis Pharyngitis is soreness (inflammation) or infection of the pharynx. It is also called a sore throat. CAUSES  Most sore throats are caused by viruses and are part of a cold. However, some sore throats are caused by strep and other bacteria. Sore throats can also be caused by post nasal drip from draining sinuses, allergies and sometimes from sleeping with an open mouth. Infectious sore throats can be spread from person to person by coughing, sneezing and sharing cups or eating utensils. TREATMENT  Sore throats that are viral usually last 3-4 days. Viral illness will get better without medications (antibiotics). Strep throat and other bacterial infections will usually begin to get better about 24-48 hours after you begin to take antibiotics. HOME CARE INSTRUCTIONS   If the caregiver feels there is a bacterial infection or if there is a positive strep test, they will prescribe an antibiotic. The full course of antibiotics must be taken. If the full course of antibiotic is not taken, you or your child may become ill again. If you or your child has strep throat and do not finish all of the medication, serious heart or kidney diseases may develop.  Drink enough water and fluids to keep your urine clear or pale yellow.  Only take over-the-counter or prescription medicines for pain, discomfort or fever as directed by your caregiver.  Get lots of rest.  Gargle with salt water ( tsp. of salt in a glass of water) as often as every 1-2 hours as you need for comfort.  Hard candies may soothe the throat if individual is not at risk for choking. Throat sprays or lozenges may also be used. SEEK MEDICAL CARE IF:   Large, tender  lumps in the neck develop.  A rash develops.  Green, yellow-brown or bloody sputum is coughed up.  Your baby is older than 3 months with a rectal temperature of 100.5 F (38.1 C) or higher for more than 1 day. SEEK IMMEDIATE MEDICAL CARE IF:   A stiff neck develops.  You or your child are drooling or unable to swallow liquids.  You or your child are vomiting, unable to keep medications or liquids down.  You or your child has severe pain, unrelieved with recommended medications.  You or your child are having difficulty breathing (not due to stuffy nose).  You or your child are unable to fully open your mouth.  You or your child develop redness, swelling, or severe pain anywhere on the neck.  You have a fever.  Your baby is older than 3 months with a rectal temperature of 102 F (38.9 C) or higher.  Your baby is 25 months old or younger with a rectal temperature of 100.4 F (38 C) or higher. MAKE SURE YOU:   Understand these instructions.  Will watch your condition.  Will get help right away if you are not doing well or get worse. Document Released: 05/27/2005 Document Revised: 08/19/2011 Document Reviewed: 08/24/2007 [facility] Patient Information 2014 [place], [place]. Salt Water Gargle This solution will help make your mouth and throat feel better. HOME CARE INSTRUCTIONS   Mix 1 teaspoon of salt in 8 ounces of warm water.  Gargle with this solution as much or often as you need or  as directed. Swish and gargle gently if you have any sores or wounds in your mouth.  Do not swallow this mixture. Document Released: 02/29/2004 Document Revised: 08/19/2011 Document Reviewed: 07/22/2008 [facility] Patient Information 2014 [place], [place].

## 2013-04-19 NOTE — ED Notes (Signed)
Pt c/o sore throat since yesterday. Sister noticed small red bumps on the L side at the back of her throat. Pain sore 3/10. Pt denies fevers, chills, N/V. A&Ox4, NAD noted.

## 2013-04-19 NOTE — ED Provider Notes (Signed)
CSN: [id number]     Arrival date & time 04/19/13  2147 History   First MD Initiated Contact with Patient 04/19/13 2149    This chart was scribed for Antony Madura PA-C, a non-physician practitioner working with No att. providers found by Lewanda Rife, ED Scribe. This patient was seen in room WTR6/WTR6 and the patient's care was started at 8:34 PM     Chief Complaint  Patient presents with  . Sore Throat   (Consider location/radiation/quality/duration/timing/severity/associated sxs/prior Treatment) The history is provided by the patient. No language interpreter was used.   HPI Comments: Diana Espinoza is a 32 y.o. female who presents to the Emergency Department complaining of constant worsening sore throat onset yesterday. Reports associated small non-purulent red bumps on left side on posterior throat, and tinnitus. Describes pain as 3/10 in severity. Denies any aggravating or alleviating factors. Denies associated neck pain, neck stiffness, dysphagia, shortness of breath, otalgia, fever, chills, nausea, emesis, and weakness. Reports trying cold-ease and ibuprofen with mild relief of symptoms. Denies sick contacts.    Past Medical History  Diagnosis Date  . Abnormal Pap smear   . Depression   . Anxiety     on meds sometimes. Coping effectively  . Pancreatitis    Past Surgical History  Procedure Laterality Date  . Ectopic pregnancy surgery  2000  . Tonsillectomy    . Cholecystectomy     Family History  Problem Relation Age of Onset  . Anesthesia problems Neg Hx    History  Substance Use Topics  . Smoking status: Never Smoker   . Smokeless tobacco: Not on file  . Alcohol Use: Yes   OB History   Grav Para Term Preterm Abortions TAB SAB Ect Mult Living   5    2 1  1  3      Review of Systems  Constitutional: Negative for fever.  HENT: Positive for sore throat and tinnitus.   All other systems reviewed and are negative.  A complete 10 system review of systems was  obtained and all systems are negative except as noted in the HPI and PMHx.  Allergies  Review of patient's allergies indicates no known allergies.  Home Medications   Current Outpatient Rx  Name  Route  Sig  Dispense  Refill  . ibuprofen (ADVIL,MOTRIN) 200 MG tablet   Oral   Take 200 mg by mouth every 6 (six) hours as needed.         Marland Kitchen HYDROcodone-acetaminophen (HYCET) 7.5-325 mg/15 ml solution   Oral   Take 15 mLs by mouth every 8 (eight) hours as needed for moderate pain.   120 mL   0   . traMADol (ULTRAM) 50 MG tablet   Oral   Take 1 tablet (50 mg total) by mouth every 6 (six) hours as needed for pain.   15 tablet   0    BP 95/57  Pulse 68  Temp(Src) 98.9 F (37.2 C) (Oral)  Resp 16  Ht 5\' 6"  (1.676 m)  Wt 155 lb (70.308 kg)  BMI 25.03 kg/m2  SpO2 100%  LMP 03/20/2013  Physical Exam  Nursing note and vitals reviewed. Constitutional: She is oriented to person, place, and time. She appears well-developed and well-nourished. No distress.  HENT:  Head: Normocephalic and atraumatic.  Right Ear: Tympanic membrane, external ear and ear canal normal. No mastoid tenderness.  Left Ear: Tympanic membrane, external ear and ear canal normal. No mastoid tenderness.  Mouth/Throat: Uvula is midline and mucous  membranes are normal. Posterior oropharyngeal erythema present. No oropharyngeal exudate.  Small ulcerations to left post-oropharynx/soft palate. Uvula midline and tonsils absent b/l. Airway is patent and patient tolerating secretions without difficulty. No palatal petechiae.  Eyes: Conjunctivae and EOM are normal. Pupils are equal, round, and reactive to light. No scleral icterus.  Neck: Normal range of motion. Neck supple.  Cardiovascular: Normal rate, regular rhythm, normal heart sounds and intact distal pulses.   Pulmonary/Chest: Effort normal and breath sounds normal. No stridor. No respiratory distress. She has no wheezes. She has no rales.  Musculoskeletal: Normal  range of motion.  Lymphadenopathy:    She has cervical adenopathy (mild left anterior cervical).  Neurological: She is alert and oriented to person, place, and time.  Skin: Skin is warm and dry. No rash noted. She is not diaphoretic. No erythema. No pallor.  Psychiatric: She has a normal mood and affect. Her behavior is normal.    ED Course  Procedures   COORDINATION OF CARE:  Nursing notes reviewed. Vital signs reviewed. Initial pt interview and examination performed.   8:34 PM-Discussed work up plan with pt at bedside, which includes rapid strep screen. Pt agrees with plan.  Treatment plan initiated:Medications - No data to display  Initial diagnostic testing ordered.    Labs Review Labs Reviewed  RAPID STREP SCREEN  CULTURE, GROUP A STREP   Imaging Review No results found.  EKG Interpretation   None       MDM   1. Pharyngitis    Uncomplicated pharyngitis, suspect to be due to a viral process. Patient well and nontoxic appearing, hemodynamically stable, and afebrile. Airway patent and patient tolerating secretions without difficulty. No drooling, vocal changes, nuchal rigidity or meningismus. No stridor. Rapid strep negative today. Patient stable for d/c with PCP follow up. Hycet prescribed for symptoms and salt water gargles advised. Return precautions discussed and patient agreeable to plan with no unaddressed concerns.  I personally performed the services described in this documentation, which was scribed in my presence. The recorded information has been reviewed and is accurate.     Antony Madura, PA-C 04/20/13 2035

## 2013-04-20 ENCOUNTER — Emergency Department (HOSPITAL_COMMUNITY)
Admission: EM | Admit: 2013-04-20 | Discharge: 2013-04-20 | Discharge status: Against Medical Advice | Payer: 59 | Attending: Emergency Medicine | Admitting: Emergency Medicine

## 2013-04-20 ENCOUNTER — Encounter (HOSPITAL_COMMUNITY): Payer: Self-pay | Admitting: Emergency Medicine

## 2013-04-20 ENCOUNTER — Telehealth (HOSPITAL_BASED_OUTPATIENT_CLINIC_OR_DEPARTMENT_OTHER): Payer: Self-pay | Admitting: *Deleted

## 2013-04-20 DIAGNOSIS — J029 Acute pharyngitis, unspecified: Secondary | ICD-10-CM | POA: Insufficient documentation

## 2013-04-20 LAB — RAPID STREP SCREEN (MED CTR MEBANE ONLY): Streptococcus, Group A Screen (Direct): NEGATIVE

## 2013-04-20 NOTE — ED Notes (Signed)
Pt c/o sore throat x 3 days with some blisters in throat

## 2013-04-20 NOTE — ED Notes (Signed)
Called to room X 1 no answer.

## 2013-04-21 ENCOUNTER — Telehealth (HOSPITAL_COMMUNITY): Payer: Self-pay

## 2013-04-21 NOTE — ED Provider Notes (Signed)
Medical screening examination/treatment/procedure(s) were performed by non-physician practitioner and as supervising physician I was immediately available for consultation/collaboration.  EKG Interpretation   None         Benny Lennert, MD 04/21/13 [phone]

## 2013-04-21 NOTE — ED Notes (Signed)
Pt calling for strep results.  ID verified.  Pt informed no final results back.  One test preliminary results and the other still in process.

## 2013-04-22 LAB — CULTURE, GROUP A STREP

## 2013-04-23 ENCOUNTER — Telehealth (HOSPITAL_COMMUNITY): Payer: Self-pay

## 2013-04-23 NOTE — ED Notes (Signed)
Pt calling for strep results.  Informed results for 11/11 are negative.

## 2013-04-24 LAB — CULTURE, GROUP A STREP

## 2013-04-25 ENCOUNTER — Emergency Department (HOSPITAL_COMMUNITY)
Admission: EM | Admit: 2013-04-25 | Discharge: 2013-04-25 | Disposition: A | Discharge status: Home / Self Care | Payer: 59 | Attending: Emergency Medicine | Admitting: Emergency Medicine

## 2013-04-25 ENCOUNTER — Encounter (HOSPITAL_COMMUNITY): Payer: Self-pay | Admitting: Emergency Medicine

## 2013-04-25 DIAGNOSIS — J029 Acute pharyngitis, unspecified: Secondary | ICD-10-CM

## 2013-04-25 DIAGNOSIS — Z8719 Personal history of other diseases of the digestive system: Secondary | ICD-10-CM | POA: Insufficient documentation

## 2013-04-25 DIAGNOSIS — Z8659 Personal history of other mental and behavioral disorders: Secondary | ICD-10-CM | POA: Insufficient documentation

## 2013-04-25 NOTE — ED Notes (Signed)
Pt states that she was treated for laryngitis and feeling better but noticed white spots on her throat on wants to be swabbed for STDs.  Pt denies any pain at this time.

## 2013-04-25 NOTE — ED Provider Notes (Signed)
CSN: [id number]     Arrival date & time 04/25/13  1051 History   First MD Initiated Contact with Patient 04/25/13 1053     No chief complaint on file.  (Consider location/radiation/quality/duration/timing/severity/associated sxs/prior Treatment) HPI Comments:  Patient presents to the emergency department with chief complaint of sore throat. Patient states that she was seen last week for the same. She states that the throat feels better, but looks worse. She denies any fevers, chills, nausea, or vomiting. She states that she is concerned about an STD. She does endorse having oral sex. She is requesting to be evaluated for this today. She states that her symptoms are worsened when drinking soda, but otherwise she is asymptomatic.  The history is provided by the patient. No language interpreter was used.    Past Medical History  Diagnosis Date  . Abnormal Pap smear   . Depression   . Anxiety     on meds sometimes. Coping effectively  . Pancreatitis    Past Surgical History  Procedure Laterality Date  . Ectopic pregnancy surgery  2000  . Tonsillectomy    . Cholecystectomy     Family History  Problem Relation Age of Onset  . Anesthesia problems Neg Hx    History  Substance Use Topics  . Smoking status: Never Smoker   . Smokeless tobacco: Not on file  . Alcohol Use: Yes   OB History   Grav Para Term Preterm Abortions TAB SAB Ect Mult Living   5    2 1  1  3      Review of Systems  All other systems reviewed and are negative.    Allergies  Review of patient's allergies indicates no known allergies.  Home Medications   Current Outpatient Rx  Name  Route  Sig  Dispense  Refill  . ibuprofen (ADVIL,MOTRIN) 200 MG tablet   Oral   Take 200 mg by mouth every 6 (six) hours as needed.          BP 112/71  Pulse 90  Temp(Src) 98.9 F (37.2 C) (Oral)  Resp 14  SpO2 10%  LMP 03/20/2013 Physical Exam  Nursing note and vitals reviewed. Constitutional: She is oriented to  person, place, and time. She appears well-developed and well-nourished.  HENT:  Head: Normocephalic and atraumatic.  Mildly inflamed oropharynx, no exudates, no signs of tonsillar or peritonsillar abscesses, uvula is midline, airway is intact  Eyes: Conjunctivae and EOM are normal. Pupils are equal, round, and reactive to light.  Neck: Normal range of motion. Neck supple.  Cardiovascular: Normal rate and regular rhythm.  Exam reveals no gallop and no friction rub.   No murmur heard. Pulmonary/Chest: Effort normal and breath sounds normal. No respiratory distress. She has no wheezes. She has no rales. She exhibits no tenderness.  Abdominal: Soft. Bowel sounds are normal. She exhibits no distension and no mass. There is no tenderness. There is no rebound and no guarding.  Musculoskeletal: Normal range of motion. She exhibits no edema and no tenderness.  Lymphadenopathy:    She has no cervical adenopathy.  Neurological: She is alert and oriented to person, place, and time.  Skin: Skin is warm and dry.  Psychiatric: She has a normal mood and affect. Her behavior is normal. Judgment and thought content normal.    ED Course  Procedures (including critical care time) Labs Review Labs Reviewed  GC/CHLAMYDIA PROBE AMP   Imaging Review No results found.  EKG Interpretation   None  MDM   1. Pharyngitis    Pt afebrile without tonsillar exudate, negative strep. Presents with mild cervical lymphadenopathy, & dysphagia; diagnosis of viral pharyngitis. No abx indicated. DC w symptomatic tx for pain  Pt does not appear dehydrated, but did discuss importance of water rehydration. Presentation non concerning for PTA or infxn spread to soft tissue. No trismus or uvula deviation. Specific return precautions discussed. Pt able to drink water in ED without difficulty with intact air way. Recommended PCP follow up.  Vital signs input air error, patient's oxygen saturation is 100%    Roxy Horseman, PA-C 04/25/13 1120

## 2013-04-26 NOTE — ED Provider Notes (Signed)
Medical screening examination/treatment/procedure(s) were performed by non-physician practitioner and as supervising physician I was immediately available for consultation/collaboration.  Megan E Docherty, MD 04/26/13 1213 

## 2013-10-30 ENCOUNTER — Emergency Department (HOSPITAL_COMMUNITY)
Admission: EM | Admit: 2013-10-30 | Discharge: 2013-10-30 | Disposition: A | Discharge status: Home / Self Care | Payer: Medicaid Other | Attending: Emergency Medicine | Admitting: Emergency Medicine

## 2013-10-30 ENCOUNTER — Emergency Department (HOSPITAL_COMMUNITY): Payer: Medicaid Other

## 2013-10-30 ENCOUNTER — Encounter (HOSPITAL_COMMUNITY): Payer: Self-pay | Admitting: Emergency Medicine

## 2013-10-30 DIAGNOSIS — F411 Generalized anxiety disorder: Secondary | ICD-10-CM | POA: Insufficient documentation

## 2013-10-30 DIAGNOSIS — Y92410 Unspecified street and highway as the place of occurrence of the external cause: Secondary | ICD-10-CM | POA: Insufficient documentation

## 2013-10-30 DIAGNOSIS — Z8719 Personal history of other diseases of the digestive system: Secondary | ICD-10-CM | POA: Insufficient documentation

## 2013-10-30 DIAGNOSIS — Z862 Personal history of diseases of the blood and blood-forming organs and certain disorders involving the immune mechanism: Secondary | ICD-10-CM | POA: Insufficient documentation

## 2013-10-30 DIAGNOSIS — T07XXXA Unspecified multiple injuries, initial encounter: Secondary | ICD-10-CM | POA: Insufficient documentation

## 2013-10-30 DIAGNOSIS — Y9389 Activity, other specified: Secondary | ICD-10-CM | POA: Insufficient documentation

## 2013-10-30 DIAGNOSIS — M795 Residual foreign body in soft tissue: Secondary | ICD-10-CM | POA: Insufficient documentation

## 2013-10-30 DIAGNOSIS — IMO0002 Reserved for concepts with insufficient information to code with codable children: Secondary | ICD-10-CM | POA: Insufficient documentation

## 2013-10-30 HISTORY — DX: Anemia, unspecified: D64.9

## 2013-10-30 MED ORDER — IBUPROFEN 800 MG PO TABS: 800.0000 mg | ORAL_TABLET | Freq: Three times a day (TID) | ORAL | Status: DC

## 2013-10-30 MED ORDER — METHOCARBAMOL 500 MG PO TABS: 500.0000 mg | ORAL_TABLET | Freq: Two times a day (BID) | ORAL | Status: DC
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
## 2013-10-30 NOTE — ED Notes (Signed)
Pt to xray. Family back to room.

## 2013-10-30 NOTE — Discharge Instructions (Signed)
Contusion  A contusion is a deep bruise. Contusions happen when an injury causes bleeding under the skin. Signs of bruising include pain, puffiness (swelling), and discolored skin. The contusion may turn blue, purple, or yellow.  HOME CARE   · Put ice on the injured area.  · Put ice in a plastic bag.  · Place a towel between your skin and the bag.  · Leave the ice on for 15-20 minutes, 03-04 times a day.  · Only take medicine as told by your doctor.  · Rest the injured area.  · If possible, raise (elevate) the injured area to lessen puffiness.  GET HELP RIGHT AWAY IF:   · You have more bruising or puffiness.  · You have pain that is getting worse.  · Your puffiness or pain is not helped by medicine.  MAKE SURE YOU:   · Understand these instructions.  · Will watch your condition.  · Will get help right away if you are not doing well or get worse.  Document Released: 11/13/2007 Document Revised: 08/19/2011 Document Reviewed: 04/01/2011  [employer]® Patient Information ©2014 [employer].

## 2013-10-30 NOTE — ED Notes (Signed)
Dr. James at BS 
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
## 2014-04-11 ENCOUNTER — Encounter (HOSPITAL_COMMUNITY): Payer: Self-pay | Admitting: Emergency Medicine

## 2014-11-03 ENCOUNTER — Inpatient Hospital Stay (HOSPITAL_COMMUNITY)
Admission: EM | Admit: 2014-11-03 | Discharge: 2014-11-03 | Disposition: A | Discharge status: Home / Self Care | Payer: Medicaid Other | Source: Ambulatory Visit | Attending: Obstetrics & Gynecology | Admitting: Obstetrics & Gynecology

## 2014-11-03 ENCOUNTER — Encounter (HOSPITAL_COMMUNITY): Payer: Self-pay | Admitting: *Deleted

## 2014-11-03 ENCOUNTER — Inpatient Hospital Stay (HOSPITAL_COMMUNITY): Payer: Medicaid Other

## 2014-11-03 DIAGNOSIS — R1031 Right lower quadrant pain: Secondary | ICD-10-CM | POA: Diagnosis not present

## 2014-11-03 DIAGNOSIS — O209 Hemorrhage in early pregnancy, unspecified: Secondary | ICD-10-CM | POA: Insufficient documentation

## 2014-11-03 DIAGNOSIS — R109 Unspecified abdominal pain: Secondary | ICD-10-CM

## 2014-11-03 DIAGNOSIS — O9989 Other specified diseases and conditions complicating pregnancy, childbirth and the puerperium: Secondary | ICD-10-CM | POA: Diagnosis not present

## 2014-11-03 DIAGNOSIS — O26899 Other specified pregnancy related conditions, unspecified trimester: Secondary | ICD-10-CM

## 2014-11-03 DIAGNOSIS — Z3A00 Weeks of gestation of pregnancy not specified: Secondary | ICD-10-CM | POA: Diagnosis not present

## 2014-11-03 DIAGNOSIS — O3680X0 Pregnancy with inconclusive fetal viability, not applicable or unspecified: Secondary | ICD-10-CM

## 2014-11-03 LAB — URINALYSIS, ROUTINE W REFLEX MICROSCOPIC
Bilirubin Urine: NEGATIVE
Glucose, UA: NEGATIVE mg/dL
Ketones, ur: 15 mg/dL — AB
Leukocytes, UA: NEGATIVE
Nitrite: NEGATIVE
PROTEIN: NEGATIVE mg/dL
Specific Gravity, Urine: 1.025 (ref 1.005–1.030)
UROBILINOGEN UA: 0.2 mg/dL (ref 0.0–1.0)
pH: 6 (ref 5.0–8.0)

## 2014-11-03 LAB — CBC WITH DIFFERENTIAL/PLATELET
BASOS ABS: 0 10*3/uL (ref 0.0–0.1)
Basophils Relative: 0 % (ref 0–1)
EOS PCT: 0 % (ref 0–5)
Eosinophils Absolute: 0 10*3/uL (ref 0.0–0.7)
HEMATOCRIT: 36.4 % (ref 36.0–46.0)
Hemoglobin: 12.2 g/dL (ref 12.0–15.0)
Lymphocytes Relative: 38 % (ref 12–46)
Lymphs Abs: 1.8 10*3/uL (ref 0.7–4.0)
MCH: 31.9 pg (ref 26.0–34.0)
MCHC: 33.5 g/dL (ref 30.0–36.0)
MCV: 95 fL (ref 78.0–100.0)
MONO ABS: 0.3 10*3/uL (ref 0.1–1.0)
Monocytes Relative: 6 % (ref 3–12)
NEUTROS ABS: 2.6 10*3/uL (ref 1.7–7.7)
Neutrophils Relative %: 56 % (ref 43–77)
PLATELETS: 345 10*3/uL (ref 150–400)
RBC: 3.83 MIL/uL — ABNORMAL LOW (ref 3.87–5.11)
RDW: 12.8 % (ref 11.5–15.5)
WBC: 4.7 10*3/uL (ref 4.0–10.5)

## 2014-11-03 LAB — URINE MICROSCOPIC-ADD ON

## 2014-11-03 LAB — HCG, QUANTITATIVE, PREGNANCY: hCG, Beta Chain, Quant, S: 306 m[IU]/mL — ABNORMAL HIGH (ref <5)

## 2014-11-03 LAB — POCT PREGNANCY, URINE: Preg Test, Ur: POSITIVE — AB

## 2014-11-03 NOTE — MAU Note (Signed)
Pt reports she has had sharp pain on the sides of her abd on and off for 4 days. Worse with movement. Took HPT and it was positive.

## 2014-11-03 NOTE — Discharge Instructions (Signed)

## 2014-11-03 NOTE — MAU Provider Note (Signed)
History     CSN: [id number]  Arrival date and time: 11/03/14 1223   First Provider Initiated Contact with Patient 11/03/14 1250      Chief Complaint  Patient presents with  . Abdominal Pain   HPI Diana Espinoza 34 y.o. [id number]  presents to MAU complaining of abdominal pain x 4 days.  It is off and on, located in right lower abdomen.  It is described as a sharp shooting pain that settles down to a dull crampy pain.  The sharp pain 7-8/10 and the dull crampy pain is more like a 5/10.  She was spotting yesterday with light brown/red blood.  No vaginal discharge, dysuria, chest pain, SOB, weakness.  She took a home pregnancy test today that was positive.   OB History    Gravida Para Term Preterm AB TAB SAB Ectopic Multiple Living   Past Medical History  Diagnosis Date  . Abnormal Pap smear   . Depression   . Anxiety     on meds sometimes. Coping effectively  . Pancreatitis   . Anemia     Past Surgical History  Procedure Laterality Date  . Ectopic pregnancy surgery  2000  . Tonsillectomy    . Cholecystectomy      Family History  Problem Relation Age of Onset  . Anesthesia problems Neg Hx     History  Substance Use Topics  . Smoking status: Never Smoker   . Smokeless tobacco: Never Used  . Alcohol Use: Yes     Comment: occ when not pregnant     Allergies: No Known Allergies  Prescriptions prior to admission  Medication Sig Dispense Refill Last Dose  . ALPRAZolam (XANAX) 1 MG tablet Take 1 mg by mouth 4 (four) times daily as needed for anxiety.   10/29/2013 at Unknown time  . ibuprofen (ADVIL,MOTRIN) 800 MG tablet Take 1 tablet (800 mg total) by mouth 3 (three) times daily. 21 tablet 0   . methocarbamol (ROBAXIN) 500 MG tablet Take 1 tablet (500 mg total) by mouth 2 (two) times daily. 20 tablet 0     ROS Pertinent ROS in HPI.  All other systems are negative.   Physical Exam   Blood pressure 104/58, pulse 86, temperature 98.7 F  (37.1 C), temperature source Oral, resp. rate 18, height  (1.6 m), weight 153 lb 3.2 oz (69.491 kg), last menstrual period 10/02/2014.  Physical Exam  Constitutional: She is oriented to person, place, and time. She appears well-developed and well-nourished. No distress.  HENT:  Head: Normocephalic and atraumatic.  Eyes: EOM are normal.  Neck: Normal range of motion.  Cardiovascular: Normal rate, regular rhythm and normal heart sounds.   Respiratory: Effort normal and breath sounds normal. No respiratory distress.  GI: Soft. Bowel sounds are normal. She exhibits no distension. There is no tenderness. There is no rebound and no guarding.  Musculoskeletal: Normal range of motion.  Neurological: She is alert and oriented to person, place, and time.  Skin: Skin is warm and dry.  Psychiatric: She has a normal mood and affect.   Results for orders placed or performed during the hospital encounter of 11/03/14 (from the past 24 hour(s))  Urinalysis, Routine w reflex microscopic (not at [facility])     Status: Abnormal   Collection Time: 11/03/14 12:35 PM  Result Value Ref Range   Color, Urine YELLOW YELLOW   APPearance CLEAR CLEAR  Specific Gravity, Urine 1.025 1.005 - 1.030   pH 6.0 5.0 - 8.0   Glucose, UA NEGATIVE NEGATIVE mg/dL   Hgb urine dipstick TRACE (A) NEGATIVE   Bilirubin Urine NEGATIVE NEGATIVE   Ketones, ur 15 (A) NEGATIVE mg/dL   Protein, ur NEGATIVE NEGATIVE mg/dL   Urobilinogen, UA 0.2 0.0 - 1.0 mg/dL   Nitrite NEGATIVE NEGATIVE   Leukocytes, UA NEGATIVE NEGATIVE  Urine microscopic-add on     Status: Abnormal   Collection Time: 11/03/14 12:35 PM  Result Value Ref Range   Squamous Epithelial / LPF FEW (A) RARE   WBC, UA 0-2 <3 WBC/hpf   RBC / HPF 3-6 <3 RBC/hpf   Bacteria, UA FEW (A) RARE   Urine-Other MUCOUS PRESENT   Pregnancy, urine POC     Status: Abnormal   Collection Time: 11/03/14 12:51 PM  Result Value Ref Range   Preg Test, Ur POSITIVE (A) NEGATIVE  CBC with  Differential/Platelet     Status: Abnormal   Collection Time: 11/03/14  1:10 PM  Result Value Ref Range   WBC 4.7 4.0 - 10.5 K/uL   RBC 3.83 (L) 3.87 - 5.11 MIL/uL   Hemoglobin 12.2 12.0 - 15.0 g/dL   HCT 86.536.4 78.436.0 - 69.646.0 %   MCV 95.0 78.0 - 100.0 fL   MCH 31.9 26.0 - 34.0 pg   MCHC 33.5 30.0 - 36.0 g/dL   RDW 29.512.8 28.411.5 - 13.215.5 %   Platelets 345 150 - 400 K/uL   Neutrophils Relative % 56 43 - 77 %   Neutro Abs 2.6 1.7 - 7.7 K/uL   Lymphocytes Relative 38 12 - 46 %   Lymphs Abs 1.8 0.7 - 4.0 K/uL   Monocytes Relative 6 3 - 12 %   Monocytes Absolute 0.3 0.1 - 1.0 K/uL   Eosinophils Relative 0 0 - 5 %   Eosinophils Absolute 0.0 0.0 - 0.7 K/uL   Basophils Relative 0 0 - 1 %   Basophils Absolute 0.0 0.0 - 0.1 K/uL  hCG, quantitative, pregnancy     Status: Abnormal   Collection Time: 11/03/14  1:10 PM  Result Value Ref Range   hCG, Beta Chain, Quant, S 306 (H) <5 mIU/mL   [place] Ob Comp Less 14 Wks  11/03/2014   CLINICAL DATA:  pelvic pain for 4 days in the right lower quadrant positive pregnancy test, gestational age by LMP is 4 weeks 4 days.  EXAM: OBSTETRIC <14 WK US AND TRANSVAGINAL OB US  TECHNIQUE: Both transabdominal and transvaginal ultrasound examinations were performed for complete evaluation of the gestation as well as the maternal uterus, adnexal regions, and pelvic cul-de-sac. Transvaginal technique was performed to assess early pregnancy.  COMPARISON:  None.  FINDINGS: Intrauterine gestational sac: Not visualized  Yolk sac:  Not visualized  Embryo:  Not visualized  Cardiac Activity: Not detected  Maternal uterus/adnexae: No visualized intrauterine pregnancy or gestational sac.  No subchorionic hemorrhage.  Right ovary appears normal. Left ovary contains 2 simple cysts, largest measuring 2.7 cm and a second cyst measuring 1.7 cm.  Trace pelvic free fluid likely physiologic.  IMPRESSION: No visualized intrauterine pregnancy or gestational sac.  Two left ovarian cysts  Trace physiologic  free fluid   Electronically Signed   By: Judie PetitM.  Shick M.D.   On: 11/03/2014 13:55   [place] Ob Transvaginal  11/03/2014   CLINICAL DATA:  pelvic pain for 4 days in the right lower quadrant positive pregnancy test, gestational age by LMP is 4  weeks 4 days.  EXAM: OBSTETRIC <14 WK [place] AND TRANSVAGINAL OB US  TECHNIQUE: Both transabdominal and transvaginal ultrasound examinations were performed for complete evaluation of the gestation as well as the maternal uterus, adnexal regions, and pelvic cul-de-sac. Transvaginal technique was performed to assess early pregnancy.  COMPARISON:  None.  FINDINGS: Intrauterine gestational sac: Not visualized  Yolk sac:  Not visualized  Embryo:  Not visualized  Cardiac Activity: Not detected  Maternal uterus/adnexae: No visualized intrauterine pregnancy or gestational sac.  No subchorionic hemorrhage.  Right ovary appears normal. Left ovary contains 2 simple cysts, largest measuring 2.7 cm and a second cyst measuring 1.7 cm.  Trace pelvic free fluid likely physiologic.  IMPRESSION: No visualized intrauterine pregnancy or gestational sac.  Two left ovarian cysts  Trace physiologic free fluid   Electronically Signed   By: Judie Petit.  Shick M.D.   On: 11/03/2014 13:55   MAU Course  Procedures  MDM Ectopic workup done to eval abd pain and bleeding in pregnancy. Labs consistent with early pregnancy Pt is hemodynamically stable No sign of infection indentified U/S without definitive IUP at this time.    Assessment and Plan  A:  1. Abdominal pain in pregnancy   2. Vaginal bleeding in pregnancy, first trimester   3. Pregnancy of unknown anatomic location      P: Discharge to home Follow up here in 48 hours to repeat HCG Ectopic precautions stressed Increase po fluid intake Patient may return to MAU as needed or if her condition were to change or worsen   Bertram Denver 11/03/2014, 12:51 PM

## 2016-04-22 ENCOUNTER — Encounter (HOSPITAL_COMMUNITY): Payer: Self-pay | Admitting: Emergency Medicine

## 2016-04-22 ENCOUNTER — Emergency Department (HOSPITAL_COMMUNITY)
Admission: EM | Admit: 2016-04-22 | Discharge: 2016-04-22 | Disposition: A | Discharge status: Home / Self Care | Payer: Medicaid Other | Attending: Emergency Medicine | Admitting: Emergency Medicine

## 2016-04-22 DIAGNOSIS — Z5321 Procedure and treatment not carried out due to patient leaving prior to being seen by health care provider: Secondary | ICD-10-CM | POA: Diagnosis not present

## 2016-04-22 DIAGNOSIS — R42 Dizziness and giddiness: Secondary | ICD-10-CM | POA: Insufficient documentation

## 2016-04-22 LAB — URINALYSIS, ROUTINE W REFLEX MICROSCOPIC
BILIRUBIN URINE: NEGATIVE
Glucose, UA: NEGATIVE mg/dL
HGB URINE DIPSTICK: NEGATIVE
Ketones, ur: NEGATIVE mg/dL
Leukocytes, UA: NEGATIVE
NITRITE: NEGATIVE
Protein, ur: NEGATIVE mg/dL
Specific Gravity, Urine: 1.02 (ref 1.005–1.030)
pH: 8 (ref 5.0–8.0)

## 2016-04-22 LAB — BASIC METABOLIC PANEL
Anion gap: 8 (ref 5–15)
BUN: 11 mg/dL (ref 6–20)
CALCIUM: 9.2 mg/dL (ref 8.9–10.3)
CO2: 25 mmol/L (ref 22–32)
Chloride: 105 mmol/L (ref 101–111)
Creatinine, Ser: 0.69 mg/dL (ref 0.44–1.00)
GLUCOSE: 102 mg/dL — AB (ref 65–99)
Potassium: 3.9 mmol/L (ref 3.5–5.1)
Sodium: 138 mmol/L (ref 135–145)

## 2016-04-22 LAB — CBC
HCT: 41.4 % (ref 36.0–46.0)
Hemoglobin: 13.5 g/dL (ref 12.0–15.0)
MCH: 31.2 pg (ref 26.0–34.0)
MCHC: 32.6 g/dL (ref 30.0–36.0)
MCV: 95.6 fL (ref 78.0–100.0)
Platelets: 286 10*3/uL (ref 150–400)
RBC: 4.33 MIL/uL (ref 3.87–5.11)
RDW: 12.7 % (ref 11.5–15.5)
WBC: 4.9 10*3/uL (ref 4.0–10.5)

## 2016-04-22 LAB — CBG MONITORING, ED
Glucose-Capillary: 76 mg/dL (ref 65–99)
Glucose-Capillary: 93 mg/dL (ref 65–99)

## 2016-04-22 LAB — I-STAT BETA HCG BLOOD, ED (MC, WL, AP ONLY): I-stat hCG, quantitative: <5 m[IU]/mL (ref <5)

## 2016-04-22 NOTE — ED Triage Notes (Addendum)
Per EMS. Pt became dizzy (room spinning sensation) while sitting at work today. Went to [facility] for this and was sent here to further eval. No syncope/near syncope. Has had meclizine prescribed for her before. Pt adds that she did feel lightheaded. No CP or SOB.

## 2016-10-04 ENCOUNTER — Encounter (HOSPITAL_COMMUNITY): Payer: Self-pay | Admitting: Emergency Medicine

## 2016-10-04 ENCOUNTER — Emergency Department (HOSPITAL_COMMUNITY)
Admission: EM | Admit: 2016-10-04 | Discharge: 2016-10-05 | Disposition: A | Discharge status: Home / Self Care | Payer: Medicaid Other | Attending: Emergency Medicine | Admitting: Emergency Medicine

## 2016-10-04 DIAGNOSIS — R1013 Epigastric pain: Secondary | ICD-10-CM | POA: Diagnosis present

## 2016-10-04 DIAGNOSIS — K2900 Acute gastritis without bleeding: Secondary | ICD-10-CM | POA: Diagnosis not present

## 2016-10-04 LAB — COMPREHENSIVE METABOLIC PANEL
ALBUMIN: 4.1 g/dL (ref 3.5–5.0)
ALK PHOS: 51 U/L (ref 38–126)
ALT: 16 U/L (ref 14–54)
ANION GAP: 6 (ref 5–15)
AST: 22 U/L (ref 15–41)
BILIRUBIN TOTAL: 0.6 mg/dL (ref 0.3–1.2)
BUN: 15 mg/dL (ref 6–20)
CALCIUM: 9 mg/dL (ref 8.9–10.3)
CO2: 26 mmol/L (ref 22–32)
Chloride: 106 mmol/L (ref 101–111)
Creatinine, Ser: 0.69 mg/dL (ref 0.44–1.00)
GFR calc non Af Amer: >60 mL/min (ref >60)
GLUCOSE: 98 mg/dL (ref 65–99)
Potassium: 3.1 mmol/L — ABNORMAL LOW (ref 3.5–5.1)
SODIUM: 138 mmol/L (ref 135–145)
TOTAL PROTEIN: 7.5 g/dL (ref 6.5–8.1)

## 2016-10-04 LAB — URINALYSIS, ROUTINE W REFLEX MICROSCOPIC
Bilirubin Urine: NEGATIVE
Glucose, UA: NEGATIVE mg/dL
Ketones, ur: NEGATIVE mg/dL
Leukocytes, UA: NEGATIVE
NITRITE: NEGATIVE
PH: 5 (ref 5.0–8.0)
PROTEIN: NEGATIVE mg/dL
Specific Gravity, Urine: 1.014 (ref 1.005–1.030)

## 2016-10-04 LAB — CBC
HEMATOCRIT: 36.7 % (ref 36.0–46.0)
Hemoglobin: 12.1 g/dL (ref 12.0–15.0)
MCH: 31.3 pg (ref 26.0–34.0)
MCHC: 33 g/dL (ref 30.0–36.0)
MCV: 94.8 fL (ref 78.0–100.0)
Platelets: 342 10*3/uL (ref 150–400)
RBC: 3.87 MIL/uL (ref 3.87–5.11)
RDW: 13 % (ref 11.5–15.5)
WBC: 5 10*3/uL (ref 4.0–10.5)

## 2016-10-04 LAB — I-STAT BETA HCG BLOOD, ED (MC, WL, AP ONLY): I-stat hCG, quantitative: <5 m[IU]/mL (ref <5)

## 2016-10-04 LAB — LIPASE, BLOOD: Lipase: 25 U/L (ref 11–51)

## 2016-10-04 NOTE — ED Provider Notes (Signed)
[facility]-EMERGENCY DEPT Provider Note   CSN: [id number] Arrival date & time: 10/04/16  2230   By signing my name below, I, Diana Espinoza, attest that this documentation has been prepared under the direction and in the presence of [name], MD  Electronically Signed: Clovis Espinoza, ED Scribe. 10/04/16. 12:04 AM.   History   Chief Complaint Chief Complaint  Patient presents with  . Abdominal Pain    HPI Comments:  Diana Espinoza is a 36 y.o. female, with a PMHx of pancreatitis and PSHx of cholecystectomy, who presents to the Emergency Department complaining of acute onset, moderate epigastric abdominal pain x yesterday. Pt states her pain comes on when she eats. She took Aleve yesterday which temporarily provided relief until today's episode of pain. Pt denies any other associated symptoms. No other complaints noted at this time.  The history is provided by the patient. No language interpreter was used.    Past Medical History:  Diagnosis Date  . Abnormal Pap smear   . Anemia   . Anxiety    on meds sometimes. Coping effectively  . Depression   . Pancreatitis     Patient Active Problem List   Diagnosis Date Noted  . Pancreatitis     Past Surgical History:  Procedure Laterality Date  . CHOLECYSTECTOMY    . ECTOPIC PREGNANCY SURGERY  2000  . TONSILLECTOMY      OB History    Gravida Para Term Preterm AB Living   SAB TAB Ectopic Multiple Live Births     1 1           Home Medications    Prior to Admission medications   Medication Sig Start Date End Date Taking? Authorizing Provider  pantoprazole (PROTONIX) 20 MG tablet Take 1 tablet (20 mg total) by mouth 2 (two) times daily before a meal. 10/05/16   [name], MD  sucralfate (CARAFATE) 1 GM/10ML suspension Take 10 mLs (1 g total) by mouth 4 (four) times daily -  with meals and at bedtime. 10/05/16   [name], MD    Family History Family History  Problem Relation  Age of Onset  . Diabetes Mother   . Hypertension Other   . Cancer Other   . Pancreatitis Other   . Anesthesia problems Neg Hx     Social History Social History  Substance Use Topics  . Smoking status: Never Smoker  . Smokeless tobacco: Never Used  . Alcohol use Yes     Comment: occ when not pregnant      Allergies   Patient has no known allergies.   Review of Systems Review of Systems  Constitutional: Negative for fever.  Gastrointestinal: Positive for abdominal pain.  All other systems reviewed and are negative.    Physical Exam Updated Vital Signs BP 125/77 (BP Location: Right Arm)   Pulse 70   Temp 98.1 F (36.7 C) (Oral)   Resp 18   Wt 158 lb (71.7 kg)   LMP 09/28/2016 (Exact Date)   SpO2 100%   BMI 27.99 kg/m   Physical Exam  Constitutional: She is oriented to person, place, and time. She appears well-developed and well-nourished. No distress.  HENT:  Head: Normocephalic and atraumatic.  Right Ear: Hearing normal.  Left Ear: Hearing normal.  Nose: Nose normal.  Mouth/Throat: Oropharynx is clear and moist and mucous membranes are normal.  Eyes: Conjunctivae and EOM are normal. Pupils  are equal, round, and reactive to light.  Neck: Normal range of motion. Neck supple.  Cardiovascular: Regular rhythm, S1 normal and S2 normal.  Exam reveals no gallop and no friction rub.   No murmur heard. Pulmonary/Chest: Effort normal and breath sounds normal. No respiratory distress. She exhibits no tenderness.  Abdominal: Soft. Normal appearance and bowel sounds are normal. There is no hepatosplenomegaly. There is tenderness in the epigastric area. There is no rebound, no guarding, no tenderness at McBurney's point and negative Murphy's sign. No hernia.  Musculoskeletal: Normal range of motion.  Neurological: She is alert and oriented to person, place, and time. She has normal strength. No cranial nerve deficit or sensory deficit. Coordination normal. GCS eye subscore is  4. GCS verbal subscore is 5. GCS motor subscore is 6.  Skin: Skin is warm, dry and intact. No rash noted. No cyanosis.  Psychiatric: She has a normal mood and affect. Her speech is normal and behavior is normal. Thought content normal.  Nursing note and vitals reviewed.    ED Treatments / Results  DIAGNOSTIC STUDIES:  Oxygen Saturation is 100% on RA, normal by my interpretation.    COORDINATION OF CARE:  12:03 AM Discussed treatment plan with pt at bedside and pt agreed to plan.  Labs (all labs ordered are listed, but only abnormal results are displayed) Labs Reviewed  COMPREHENSIVE METABOLIC PANEL - Abnormal; Notable for the following:       Result Value   Potassium 3.1 (*)    All other components within normal limits  URINALYSIS, ROUTINE W REFLEX MICROSCOPIC - Abnormal; Notable for the following:    APPearance HAZY (*)    Hgb urine dipstick SMALL (*)    Bacteria, UA RARE (*)    Squamous Epithelial / LPF 0-5 (*)    All other components within normal limits  LIPASE, BLOOD  CBC  I-STAT BETA HCG BLOOD, ED ([facility], [facility], [facility] ONLY)    EKG  EKG Interpretation None       Radiology No results found.  Procedures Procedures (including critical care time)  Medications Ordered in ED Medications  gi cocktail (Maalox,Lidocaine,Donnatal) (30 mLs Oral Given 10/05/16 0051)  famotidine (PEPCID) tablet 40 mg (40 mg Oral Given 10/05/16 0051)  oxyCODONE-acetaminophen (PERCOCET/ROXICET) 5-325 MG per tablet 1 tablet (1 tablet Oral Given 10/05/16 0051)     Initial Impression / Assessment and Plan / ED Course  I have reviewed the triage vital signs and the nursing notes.  Pertinent labs & imaging results that were available during my care of the patient were reviewed by me and considered in my medical decision making (see chart for details).     Patient presents to the emergency part for evaluation of epigastric discomfort. Patient is concerned because she has a history of pancreatitis.  Pancreatitis, however, was likely related to cholecystectomy and ERCP procedure. She has not at risk for recurrent pancreatitis. Lipase is normal. Lab work was all within normal limits. Patient had a benign abdominal exam. Workup is most consistent with reflux, gastritis, cannot rule out peptic ulcer disease. She reports that her pain is worsened by eating. She had significant improvement here with GI cocktail, Pepcid, Percocet. Will treat for presumed reflux, have follow-up with her gastroenterologist Corinda Gubler)  Final Clinical Impressions(s) / ED Diagnoses   Final diagnoses:  Epigastric pain  Acute gastritis without hemorrhage, unspecified gastritis type    New Prescriptions New Prescriptions   PANTOPRAZOLE (PROTONIX) 20 MG TABLET    Take 1 tablet (20  mg total) by mouth 2 (two) times daily before a meal.   SUCRALFATE (CARAFATE) 1 GM/10ML SUSPENSION    Take 10 mLs (1 g total) by mouth 4 (four) times daily -  with meals and at bedtime.  I personally performed the services described in this documentation, which was scribed in my presence. The recorded information has been reviewed and is accurate.     [name], MD 10/05/16 [phone]

## 2016-10-04 NOTE — ED Triage Notes (Signed)
Pt states she had epigastric pain yesterday so she took some aleve and it got better but today it came back and is worse  Pt states she has nausea without vomiting or diarrhea   Pt also c/o headache  Pt has hx of pancreatitis

## 2016-10-05 MED ORDER — PANTOPRAZOLE SODIUM 20 MG PO TBEC
20.0000 mg | DELAYED_RELEASE_TABLET | Freq: Two times a day (BID) | ORAL | 0 refills | Status: DC
Start: 2016-10-05 — End: 2018-12-07

## 2016-10-05 MED ORDER — OXYCODONE-ACETAMINOPHEN 5-325 MG PO TABS
1.0000 | ORAL_TABLET | Freq: Once | ORAL | Status: AC
  Administered 2016-10-05: 1 via ORAL
  Filled 2016-10-05: qty 1

## 2016-10-05 MED ORDER — SUCRALFATE 1 GM/10ML PO SUSP: 1.0000 g | Freq: Three times a day (TID) | ORAL | 0 refills | Status: DC

## 2016-10-05 MED ORDER — GI COCKTAIL ~~LOC~~
30.0000 mL | Freq: Once | ORAL | Status: AC
  Administered 2016-10-05: 30 mL via ORAL
  Filled 2016-10-05: qty 30

## 2016-10-05 MED ORDER — FAMOTIDINE 20 MG PO TABS
40.0000 mg | ORAL_TABLET | Freq: Once | ORAL | Status: AC
  Administered 2016-10-05: 40 mg via ORAL
  Filled 2016-10-05: qty 2

## 2016-10-05 NOTE — ED Notes (Signed)
ED Provider at bedside. 

## 2017-08-31 ENCOUNTER — Emergency Department (HOSPITAL_COMMUNITY)
Admission: EM | Admit: 2017-08-31 | Discharge: 2017-09-01 | Disposition: A | Discharge status: Home / Self Care | Payer: No Typology Code available for payment source | Attending: Emergency Medicine | Admitting: Emergency Medicine

## 2017-08-31 ENCOUNTER — Encounter (HOSPITAL_COMMUNITY): Payer: Self-pay | Admitting: Nurse Practitioner

## 2017-08-31 ENCOUNTER — Emergency Department (HOSPITAL_COMMUNITY): Payer: No Typology Code available for payment source

## 2017-08-31 DIAGNOSIS — S9032XA Contusion of left foot, initial encounter: Secondary | ICD-10-CM | POA: Diagnosis not present

## 2017-08-31 DIAGNOSIS — S40012A Contusion of left shoulder, initial encounter: Secondary | ICD-10-CM | POA: Diagnosis not present

## 2017-08-31 DIAGNOSIS — M549 Dorsalgia, unspecified: Secondary | ICD-10-CM | POA: Diagnosis not present

## 2017-08-31 DIAGNOSIS — Y92414 Local residential or business street as the place of occurrence of the external cause: Secondary | ICD-10-CM | POA: Insufficient documentation

## 2017-08-31 DIAGNOSIS — Y9389 Activity, other specified: Secondary | ICD-10-CM | POA: Insufficient documentation

## 2017-08-31 DIAGNOSIS — S1980XA Other specified injuries of unspecified part of neck, initial encounter: Secondary | ICD-10-CM | POA: Diagnosis present

## 2017-08-31 DIAGNOSIS — Z79899 Other long term (current) drug therapy: Secondary | ICD-10-CM | POA: Insufficient documentation

## 2017-08-31 DIAGNOSIS — S161XXA Strain of muscle, fascia and tendon at neck level, initial encounter: Secondary | ICD-10-CM | POA: Diagnosis not present

## 2017-08-31 DIAGNOSIS — Y999 Unspecified external cause status: Secondary | ICD-10-CM | POA: Diagnosis not present

## 2017-08-31 LAB — POC URINE PREG, ED: PREG TEST UR: NEGATIVE

## 2017-08-31 MED ORDER — IBUPROFEN 200 MG PO TABS
400.0000 mg | ORAL_TABLET | Freq: Once | ORAL | Status: AC
  Administered 2017-09-01: 400 mg via ORAL
  Filled 2017-08-31: qty 2
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
## 2017-09-01 ENCOUNTER — Emergency Department (HOSPITAL_COMMUNITY): Payer: No Typology Code available for payment source

## 2017-09-01 NOTE — Discharge Instructions (Addendum)
Apply ice several times a day. Take ibuprofen, naproxen, or acetaminophen as needed for pain. °

## 2017-09-01 NOTE — ED Notes (Signed)
Patient transported to X-ray 

## 2017-09-16 ENCOUNTER — Encounter (HOSPITAL_COMMUNITY): Payer: Self-pay | Admitting: *Deleted

## 2017-09-16 ENCOUNTER — Emergency Department (HOSPITAL_COMMUNITY)
Admission: EM | Admit: 2017-09-16 | Discharge: 2017-09-16 | Disposition: A | Discharge status: Home / Self Care | Payer: Self-pay | Attending: Emergency Medicine | Admitting: Emergency Medicine

## 2017-09-16 ENCOUNTER — Emergency Department (HOSPITAL_COMMUNITY): Payer: Self-pay

## 2017-09-16 DIAGNOSIS — Y92410 Unspecified street and highway as the place of occurrence of the external cause: Secondary | ICD-10-CM | POA: Insufficient documentation

## 2017-09-16 DIAGNOSIS — M79672 Pain in left foot: Secondary | ICD-10-CM | POA: Insufficient documentation

## 2017-09-16 DIAGNOSIS — Y9389 Activity, other specified: Secondary | ICD-10-CM | POA: Insufficient documentation

## 2017-09-16 DIAGNOSIS — S161XXA Strain of muscle, fascia and tendon at neck level, initial encounter: Secondary | ICD-10-CM | POA: Diagnosis not present

## 2017-09-16 DIAGNOSIS — K0889 Other specified disorders of teeth and supporting structures: Secondary | ICD-10-CM | POA: Diagnosis not present

## 2017-09-16 DIAGNOSIS — Y999 Unspecified external cause status: Secondary | ICD-10-CM | POA: Diagnosis not present

## 2017-09-16 DIAGNOSIS — S199XXA Unspecified injury of neck, initial encounter: Secondary | ICD-10-CM | POA: Diagnosis present

## 2017-09-16 MED ORDER — IBUPROFEN 200 MG PO TABS
600.0000 mg | ORAL_TABLET | Freq: Once | ORAL | Status: AC
  Administered 2017-09-16: 600 mg via ORAL
  Filled 2017-09-16: qty 3

## 2017-09-16 MED ORDER — PENICILLIN V POTASSIUM 500 MG PO TABS: 500.0000 mg | ORAL_TABLET | Freq: Three times a day (TID) | ORAL | 0 refills | Status: DC

## 2017-09-16 MED ORDER — CYCLOBENZAPRINE HCL 10 MG PO TABS: 10.0000 mg | ORAL_TABLET | Freq: Three times a day (TID) | ORAL | 0 refills | Status: DC | PRN

## 2017-09-16 MED ORDER — ACETAMINOPHEN 325 MG PO TABS
650.0000 mg | ORAL_TABLET | Freq: Once | ORAL | Status: AC
  Administered 2017-09-16: 650 mg via ORAL
  Filled 2017-09-16: qty 2
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
## 2017-09-16 NOTE — Discharge Instructions (Signed)
Please call a dentist for follow up °
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
## 2018-05-30 ENCOUNTER — Emergency Department (HOSPITAL_COMMUNITY)
Admission: EM | Admit: 2018-05-30 | Discharge: 2018-05-30 | Disposition: A | Discharge status: Home / Self Care | Payer: 59 | Attending: Emergency Medicine | Admitting: Emergency Medicine

## 2018-05-30 ENCOUNTER — Encounter (HOSPITAL_COMMUNITY): Payer: Self-pay

## 2018-05-30 ENCOUNTER — Emergency Department (HOSPITAL_COMMUNITY): Payer: 59

## 2018-05-30 ENCOUNTER — Other Ambulatory Visit: Payer: Self-pay

## 2018-05-30 DIAGNOSIS — B9789 Other viral agents as the cause of diseases classified elsewhere: Secondary | ICD-10-CM | POA: Diagnosis not present

## 2018-05-30 DIAGNOSIS — J028 Acute pharyngitis due to other specified organisms: Secondary | ICD-10-CM | POA: Insufficient documentation

## 2018-05-30 DIAGNOSIS — R21 Rash and other nonspecific skin eruption: Secondary | ICD-10-CM

## 2018-05-30 DIAGNOSIS — J029 Acute pharyngitis, unspecified: Secondary | ICD-10-CM

## 2018-05-30 DIAGNOSIS — Z79899 Other long term (current) drug therapy: Secondary | ICD-10-CM | POA: Insufficient documentation

## 2018-05-30 LAB — GROUP A STREP BY PCR: Group A Strep by PCR: NOT DETECTED

## 2018-05-30 MED ORDER — LIDOCAINE VISCOUS HCL 2 % MT SOLN: 10.0000 mL | OROMUCOSAL | 0 refills | Status: DC | PRN

## 2018-05-30 MED ORDER — DEXAMETHASONE 4 MG PO TABS
10.0000 mg | ORAL_TABLET | Freq: Once | ORAL | Status: AC
  Administered 2018-05-30: 13:00:00 10 mg via ORAL
  Filled 2018-05-30: qty 2

## 2018-05-30 MED ORDER — LIDOCAINE VISCOUS HCL 2 % MT SOLN
15.0000 mL | Freq: Once | OROMUCOSAL | Status: AC
  Administered 2018-05-30: 15 mL via OROMUCOSAL
  Filled 2018-05-30: qty 15

## 2018-05-30 MED ORDER — HYDROXYZINE HCL 25 MG PO TABS: 25.0000 mg | ORAL_TABLET | Freq: Three times a day (TID) | ORAL | 0 refills | Status: DC | PRN

## 2018-05-30 MED ORDER — NAPROXEN 375 MG PO TABS: 375.0000 mg | ORAL_TABLET | Freq: Two times a day (BID) | ORAL | 0 refills | Status: DC

## 2018-05-30 NOTE — ED Provider Notes (Signed)
[facility]-EMERGENCY DEPT Provider Note   CSN: [id number] Arrival date & time: 05/30/18  4098     History   Chief Complaint No chief complaint on file.   HPI Diana Espinoza is a 37 y.o. female who presents to the ED with sore throat that started 5 days ago and has continued.   The history is provided by the patient. No language interpreter was used.  Sore Throat  This is a new problem. The current episode started more than 2 days ago. The problem occurs constantly. The problem has been gradually worsening. Associated symptoms include headaches. Pertinent negatives include no chest pain, no abdominal pain and no shortness of breath. The symptoms are aggravated by swallowing.    Past Medical History:  Diagnosis Date  . Abnormal Pap smear   . Anemia   . Anxiety    on meds sometimes. Coping effectively  . Depression   . Pancreatitis     Patient Active Problem List   Diagnosis Date Noted  . Pancreatitis     Past Surgical History:  Procedure Laterality Date  . CHOLECYSTECTOMY    . ECTOPIC PREGNANCY SURGERY  2000  . TONSILLECTOMY       OB History    Gravida  5   Para      Term      Preterm      AB  2   Living  3     SAB      TAB  1   Ectopic  1   Multiple      Live Births               Home Medications    Prior to Admission medications   Medication Sig Start Date End Date Taking? Authorizing Provider  cyclobenzaprine (FLEXERIL) 10 MG tablet Take 1 tablet (10 mg total) by mouth 3 (three) times daily as needed for muscle spasms. 09/16/17   Azalia Bilis, MD  hydrOXYzine (ATARAX/VISTARIL) 25 MG tablet Take 1 tablet (25 mg total) by mouth every 8 (eight) hours as needed. 05/30/18   Janne Napoleon, NP  lidocaine (XYLOCAINE) 2 % solution Use as directed 10 mLs in the mouth or throat as needed for mouth pain. 05/30/18   Janne Napoleon, NP  naproxen (NAPROSYN) 375 MG tablet Take 1 tablet (375 mg total) by mouth 2 (two) times daily.  05/30/18   Janne Napoleon, NP  pantoprazole (PROTONIX) 20 MG tablet Take 1 tablet (20 mg total) by mouth 2 (two) times daily before a meal. 10/05/16   Pollina, Canary Brim, MD  penicillin v potassium (VEETID) 500 MG tablet Take 1 tablet (500 mg total) by mouth 3 (three) times daily. 09/16/17   Azalia Bilis, MD  sucralfate (CARAFATE) 1 GM/10ML suspension Take 10 mLs (1 g total) by mouth 4 (four) times daily -  with meals and at bedtime. 10/05/16   [name], MD    Family History Family History  Problem Relation Age of Onset  . Diabetes Mother   . Hypertension Other   . Cancer Other   . Pancreatitis Other   . Anesthesia problems Neg Hx     Social History Social History   Tobacco Use  . Smoking status: Never Smoker  . Smokeless tobacco: Never Used  Substance Use Topics  . Alcohol use: Yes    Comment: occ when not pregnant   . Drug use: No     Allergies   Patient has no  known allergies.   Review of Systems Review of Systems  Constitutional: Negative for chills and fever.  HENT: Positive for congestion and sore throat. Negative for ear pain, mouth sores, sinus pain and trouble swallowing.   Eyes: Negative for pain, discharge and redness.  Respiratory: Negative for shortness of breath and wheezing.   Cardiovascular: Negative for chest pain.  Gastrointestinal: Negative for abdominal pain, nausea and vomiting.  Genitourinary: Negative for decreased urine volume, dysuria, frequency and hematuria.  Musculoskeletal: Negative for myalgias and neck stiffness.  Skin: Positive for rash.  Neurological: Positive for headaches.  Psychiatric/Behavioral: Negative for confusion. The patient is not nervous/anxious.      Physical Exam Updated Vital Signs BP 118/73   Pulse 77   Temp 97.9 F (36.6 C) (Oral)   Resp 18   Ht 5\' 4"  (1.626 m)   Wt 73.5 kg   LMP 05/21/2018   SpO2 100%   BMI 27.81 kg/m   Physical Exam Vitals signs and nursing note reviewed.    Constitutional:      General: She is not in acute distress.    Appearance: She is well-developed.  HENT:     Head: Normocephalic.     Right Ear: Tympanic membrane normal.     Left Ear: Tympanic membrane normal.     Nose: Congestion present.     Mouth/Throat:     Mouth: Mucous membranes are moist.     Pharynx: Posterior oropharyngeal erythema present.  Eyes:     Extraocular Movements: Extraocular movements intact.     Conjunctiva/sclera: Conjunctivae normal.     Pupils: Pupils are equal, round, and reactive to light.  Neck:     Musculoskeletal: Normal range of motion and neck supple.  Cardiovascular:     Rate and Rhythm: Normal rate and regular rhythm.  Pulmonary:     Effort: Pulmonary effort is normal.     Breath sounds: No wheezing, rhonchi or rales.  Abdominal:     Palpations: Abdomen is soft.     Tenderness: There is no abdominal tenderness.  Musculoskeletal: Normal range of motion.  Lymphadenopathy:     Cervical: Cervical adenopathy present.  Skin:    Findings: Rash present.     Comments: There area a few red raised area to the upper extremities that appear as insect bites.  Neurological:     Mental Status: She is alert and oriented to person, place, and time.  Psychiatric:        Mood and Affect: Mood normal.      ED Treatments / Results  Labs (all labs ordered are listed, but only abnormal results are displayed) Labs Reviewed  GROUP A STREP BY PCR   Radiology Dg Chest 2 View  Result Date: 05/30/2018 CLINICAL DATA:  Sore throat 6 days. EXAM: CHEST - 2 VIEW COMPARISON:  10/30/2013 FINDINGS: Lungs are adequately inflated and otherwise clear. Cardiomediastinal silhouette, bones and soft tissues are normal. Previous cholecystectomy. IMPRESSION: No active cardiopulmonary disease. Electronically Signed   By: Elberta Fortisaniel  Boyle M.D.   On: 05/30/2018 11:56    Procedures Procedures (including critical care time)  Medications Ordered in ED Medications  dexamethasone  (DECADRON) tablet 10 mg (10 mg Oral Given 05/30/18 1234)  lidocaine (XYLOCAINE) 2 % viscous mouth solution 15 mL (15 mLs Mouth/Throat Given 05/30/18 1234)     Initial Impression / Assessment and Plan / ED Course  I have reviewed the triage vital signs and the nursing notes. Pt afebrile without tonsillar exudate, negative strep. Presents with  mild cervical lymphadenopathy, & dysphagia; diagnosis of viral pharyngitis. No abx indicated. Discharged with symptomatic tx for pain  Pt does not appear dehydrated, but did discuss importance of water rehydration. Presentation non concerning for PTA or RPA. No trismus or uvula deviation. Specific return precautions discussed. Pt able to drink water in ED without difficulty with intact air way. Recommended PCP follow up. Patient with rash that appears as insect bites. Will treat with Atarax for itching.  Final Clinical Impressions(s) / ED Diagnoses   Final diagnoses:  Acute viral pharyngitis  Rash    ED Discharge Orders         Ordered    hydrOXYzine (ATARAX/VISTARIL) 25 MG tablet  Every 8 hours PRN     05/30/18 1319    naproxen (NAPROSYN) 375 MG tablet  2 times daily     05/30/18 1319    lidocaine (XYLOCAINE) 2 % solution  As needed     05/30/18 1319           Damian Leavelleese, Brock[place] M, [place]NP 05/30/18 1321    Derwood KaplanNanavati, Ankit, MD 05/31/18 [phone]

## 2018-05-30 NOTE — Discharge Instructions (Signed)
The medication for the rash and itching can make you sleepy so do not drive while taking it. Follow up with your doctor or return here as needed for worsening symptoms.

## 2018-05-30 NOTE — ED Triage Notes (Signed)
Patient presented to ed with c/o sore throat started on Monday until now. Patient c/o 4/10 sore throat at this time.

## 2018-05-30 NOTE — ED Notes (Signed)
Bed: WTR9 Expected date:  Expected time:  Means of arrival:  Comments: 

## 2018-06-29 ENCOUNTER — Other Ambulatory Visit: Payer: Self-pay

## 2018-06-29 ENCOUNTER — Emergency Department (HOSPITAL_COMMUNITY)
Admission: EM | Admit: 2018-06-29 | Discharge: 2018-06-29 | Disposition: A | Discharge status: Home / Self Care | Payer: 59 | Attending: Emergency Medicine | Admitting: Emergency Medicine

## 2018-06-29 ENCOUNTER — Encounter (HOSPITAL_COMMUNITY): Payer: Self-pay

## 2018-06-29 DIAGNOSIS — R251 Tremor, unspecified: Secondary | ICD-10-CM | POA: Diagnosis not present

## 2018-06-29 DIAGNOSIS — Z9049 Acquired absence of other specified parts of digestive tract: Secondary | ICD-10-CM | POA: Insufficient documentation

## 2018-06-29 DIAGNOSIS — F419 Anxiety disorder, unspecified: Secondary | ICD-10-CM | POA: Insufficient documentation

## 2018-06-29 DIAGNOSIS — F10120 Alcohol abuse with intoxication, uncomplicated: Secondary | ICD-10-CM

## 2018-06-29 DIAGNOSIS — F10129 Alcohol abuse with intoxication, unspecified: Secondary | ICD-10-CM | POA: Insufficient documentation

## 2018-06-29 DIAGNOSIS — R197 Diarrhea, unspecified: Secondary | ICD-10-CM | POA: Diagnosis present

## 2018-06-29 DIAGNOSIS — E860 Dehydration: Secondary | ICD-10-CM | POA: Diagnosis not present

## 2018-06-29 DIAGNOSIS — F329 Major depressive disorder, single episode, unspecified: Secondary | ICD-10-CM | POA: Diagnosis not present

## 2018-06-29 LAB — CBC WITH DIFFERENTIAL/PLATELET
ABS IMMATURE GRANULOCYTES: 0.01 10*3/uL (ref 0.00–0.07)
BASOS PCT: 0 %
Basophils Absolute: 0 10*3/uL (ref 0.0–0.1)
EOS PCT: 0 %
Eosinophils Absolute: 0 10*3/uL (ref 0.0–0.5)
HCT: 40.2 % (ref 36.0–46.0)
Hemoglobin: 12.6 g/dL (ref 12.0–15.0)
Immature Granulocytes: 0 %
Lymphocytes Relative: 21 %
Lymphs Abs: 1.1 10*3/uL (ref 0.7–4.0)
MCH: 32.1 pg (ref 26.0–34.0)
MCHC: 31.3 g/dL (ref 30.0–36.0)
MCV: 102.3 fL — AB (ref 80.0–100.0)
MONO ABS: 0.3 10*3/uL (ref 0.1–1.0)
MONOS PCT: 6 %
NEUTROS ABS: 3.8 10*3/uL (ref 1.7–7.7)
Neutrophils Relative %: 73 %
PLATELETS: 342 10*3/uL (ref 150–400)
RBC: 3.93 MIL/uL (ref 3.87–5.11)
RDW: 13.1 % (ref 11.5–15.5)
WBC: 5.3 10*3/uL (ref 4.0–10.5)
nRBC: 0 % (ref 0.0–0.2)

## 2018-06-29 LAB — COMPREHENSIVE METABOLIC PANEL
ALT: 27 U/L (ref 0–44)
AST: 47 U/L — AB (ref 15–41)
Albumin: 4.1 g/dL (ref 3.5–5.0)
Alkaline Phosphatase: 56 U/L (ref 38–126)
Anion gap: 11 (ref 5–15)
BUN: 9 mg/dL (ref 6–20)
CHLORIDE: 104 mmol/L (ref 98–111)
CO2: 22 mmol/L (ref 22–32)
CREATININE: 0.62 mg/dL (ref 0.44–1.00)
Calcium: 8.7 mg/dL — ABNORMAL LOW (ref 8.9–10.3)
GFR calc Af Amer: >60 mL/min (ref >60)
GFR calc non Af Amer: >60 mL/min (ref >60)
Glucose, Bld: 98 mg/dL (ref 70–99)
Potassium: 3.6 mmol/L (ref 3.5–5.1)
SODIUM: 137 mmol/L (ref 135–145)
TOTAL PROTEIN: 7.6 g/dL (ref 6.5–8.1)
Total Bilirubin: 0.6 mg/dL (ref 0.3–1.2)

## 2018-06-29 LAB — URINALYSIS, ROUTINE W REFLEX MICROSCOPIC
Bilirubin Urine: NEGATIVE
Glucose, UA: NEGATIVE mg/dL
Hgb urine dipstick: NEGATIVE
KETONES UR: 80 mg/dL — AB
Leukocytes, UA: NEGATIVE
NITRITE: NEGATIVE
PROTEIN: NEGATIVE mg/dL
Specific Gravity, Urine: 1.021 (ref 1.005–1.030)
pH: 8 (ref 5.0–8.0)

## 2018-06-29 LAB — I-STAT BETA HCG BLOOD, ED (MC, WL, AP ONLY)

## 2018-06-29 LAB — LIPASE, BLOOD: Lipase: 29 U/L (ref 11–51)

## 2018-06-29 MED ORDER — ONDANSETRON HCL 4 MG/2ML IJ SOLN
4.0000 mg | Freq: Once | INTRAMUSCULAR | Status: AC
  Administered 2018-06-29: 4 mg via INTRAVENOUS
  Filled 2018-06-29: qty 2

## 2018-06-29 MED ORDER — ONDANSETRON HCL 4 MG PO TABS: 4.0000 mg | ORAL_TABLET | Freq: Three times a day (TID) | ORAL | 0 refills | Status: DC | PRN

## 2018-06-29 MED ORDER — SODIUM CHLORIDE 0.9% FLUSH
3.0000 mL | Freq: Once | INTRAVENOUS | Status: AC
  Administered 2018-06-29: 3 mL via INTRAVENOUS

## 2018-06-29 MED ORDER — SODIUM CHLORIDE 0.9 % IV BOLUS
1000.0000 mL | Freq: Once | INTRAVENOUS | Status: AC
  Administered 2018-06-29: 1000 mL via INTRAVENOUS

## 2018-06-29 NOTE — ED Provider Notes (Signed)
[facility]-EMERGENCY DEPT Provider Note   CSN: [id number] Arrival date & time: 06/29/18  1312     History   Chief Complaint Chief Complaint  Patient presents with  . Diarrhea    HPI Diana Espinoza is a 38 y.o. female presenting for evaluation of diarrhea and weakness.   Pt states she has had multiple episodes of nonbloody diarrhea today. She states she feels weak and dehydrated. She drank a bottle of wine last night, states she does this most nights. She has not had anything to eat. She drank 2 cups of water, states she felt like she needed to throw up, but didn't. She denies fevers, chills, cp, sob, abd pain, or unrinary sxs. She states her coworker recently had the flu. She denies recent travel or other sick contacts. Has a h/o cholecystectomy and ectopic pregnancy, no other intraabd surgeries. Pt's last periord was last week. She has no medical problems, takes no medications daily.   HPI  Past Medical History:  Diagnosis Date  . Abnormal Pap smear   . Anemia   . Anxiety    on meds sometimes. Coping effectively  . Depression   . Pancreatitis     Patient Active Problem List   Diagnosis Date Noted  . Pancreatitis     Past Surgical History:  Procedure Laterality Date  . CHOLECYSTECTOMY    . ECTOPIC PREGNANCY SURGERY  2000  . TONSILLECTOMY       OB History    Gravida  5   Para      Term      Preterm      AB  2   Living  3     SAB      TAB  1   Ectopic  1   Multiple      Live Births               Home Medications    Prior to Admission medications   Medication Sig Start Date End Date Taking? Authorizing Provider  Aspirin-Salicylamide-Caffeine (BC HEADACHE POWDER PO) Take 1 packet by mouth daily as needed (pain).   Yes [provider]  cyclobenzaprine (FLEXERIL) 10 MG tablet Take 1 tablet (10 mg total) by mouth 3 (three) times daily as needed for muscle spasms. Patient not taking: Reported on 06/29/2018 09/16/17    Azalia Bilis, MD  hydrOXYzine (ATARAX/VISTARIL) 25 MG tablet Take 1 tablet (25 mg total) by mouth every 8 (eight) hours as needed. Patient not taking: Reported on 06/29/2018 05/30/18   Janne Napoleon, NP  lidocaine (XYLOCAINE) 2 % solution Use as directed 10 mLs in the mouth or throat as needed for mouth pain. Patient not taking: Reported on 06/29/2018 05/30/18   Janne Napoleon, NP  naproxen (NAPROSYN) 375 MG tablet Take 1 tablet (375 mg total) by mouth 2 (two) times daily. Patient not taking: Reported on 06/29/2018 05/30/18   Janne Napoleon, NP  ondansetron (ZOFRAN) 4 MG tablet Take 1 tablet (4 mg total) by mouth every 8 (eight) hours as needed for nausea or vomiting. 06/29/18   Mellina Benison, PA-C  pantoprazole (PROTONIX) 20 MG tablet Take 1 tablet (20 mg total) by mouth 2 (two) times daily before a meal. Patient not taking: Reported on 06/29/2018 10/05/16   [name], MD  penicillin v potassium (VEETID) 500 MG tablet Take 1 tablet (500 mg total) by mouth 3 (three) times daily. Patient not taking: Reported on 06/29/2018 09/16/17   Azalia Bilis,  MD  sucralfate (CARAFATE) 1 GM/10ML suspension Take 10 mLs (1 g total) by mouth 4 (four) times daily -  with meals and at bedtime. Patient not taking: Reported on 06/29/2018 10/05/16   Gilda CreasePollina, Christopher J, MD    Family History Family History  Problem Relation Age of Onset  . Diabetes Mother   . Hypertension Other   . Cancer Other   . Pancreatitis Other   . Anesthesia problems Neg Hx     Social History Social History   Tobacco Use  . Smoking status: Never Smoker  . Smokeless tobacco: Never Used  Substance Use Topics  . Alcohol use: Yes  . Drug use: No     Allergies   Patient has no known allergies.   Review of Systems Review of Systems  Gastrointestinal: Positive for diarrhea.  Neurological: Positive for weakness.  All other systems reviewed and are negative.    Physical Exam Updated Vital Signs BP 128/82 (BP  Location: Right Arm)   Pulse 85   Temp 98.5 F (36.9 C) (Oral)   Resp 18   Ht 5\' 4"  (1.626 m)   Wt 74.8 kg   LMP 06/22/2018 (Approximate)   SpO2 100%   BMI 28.32 kg/m   Physical Exam Vitals signs and nursing note reviewed.  Constitutional:      General: She is not in acute distress.    Appearance: She is well-developed.     Comments: Appears nontoxic  HENT:     Head: Normocephalic and atraumatic.     Mouth/Throat:     Mouth: Mucous membranes are moist.  Eyes:     Conjunctiva/sclera: Conjunctivae normal.     Pupils: Pupils are equal, round, and reactive to light.  Neck:     Musculoskeletal: Normal range of motion and neck supple.  Cardiovascular:     Rate and Rhythm: Normal rate and regular rhythm.     Pulses: Normal pulses.  Pulmonary:     Effort: Pulmonary effort is normal. No respiratory distress.     Breath sounds: Normal breath sounds. No wheezing.  Abdominal:     General: There is no distension.     Palpations: Abdomen is soft. There is no mass.     Tenderness: There is no abdominal tenderness. There is no guarding or rebound.     Comments: No TTP of the abd. Soft without rigidity, guarding, distention. Negative rebound.   Musculoskeletal: Normal range of motion.     Comments: Pt is shaky, but no seizure like activity.   Skin:    General: Skin is warm and dry.     Capillary Refill: Capillary refill takes less than 2 seconds.  Neurological:     Mental Status: She is alert and oriented to person, place, and time.      ED Treatments / Results  Labs (all labs ordered are listed, but only abnormal results are displayed) Labs Reviewed  COMPREHENSIVE METABOLIC PANEL - Abnormal; Notable for the following components:      Result Value   Calcium 8.7 (*)    AST 47 (*)    All other components within normal limits  URINALYSIS, ROUTINE W REFLEX MICROSCOPIC - Abnormal; Notable for the following components:   Ketones, ur 80 (*)    All other components within normal  limits  CBC WITH DIFFERENTIAL/PLATELET - Abnormal; Notable for the following components:   MCV 102.3 (*)    All other components within normal limits  LIPASE, BLOOD  I-STAT BETA HCG BLOOD, ED ([facility],  [facility], [facility] ONLY)    EKG None  Radiology No results found.  Procedures Procedures (including critical care time)  Medications Ordered in ED Medications  sodium chloride flush (NS) 0.9 % injection 3 mL (3 mLs Intravenous Given 06/29/18 1348)  sodium chloride 0.9 % bolus 1,000 mL (0 mLs Intravenous Stopped 06/29/18 1506)  ondansetron (ZOFRAN) injection 4 mg (4 mg Intravenous Given 06/29/18 1406)     Initial Impression / Assessment and Plan / ED Course  I have reviewed the triage vital signs and the nursing notes.  Pertinent labs & imaging results that were available during my care of the patient were reviewed by me and considered in my medical decision making (see chart for details).     Patient presenting for evaluation of diarrhea, dehydration, and shakiness.  Physical exam shows patient is afebrile and appears nontoxic.  No abdominal tenderness or concern for surgical abdomen.  Abd labs drawn prior to my eval. Will given fluids and zofran for sx control.   Labs reassuring.  No leukocytosis.  Kidney, liver, pancreatic function reassuring.  No UTI.  On reassessment, patient reports symptoms are improved.  Rating p.o. without difficulty.  Discussed importance of alcohol cessation with patient, resources given and peer support consult ordered.  Will discharge with Zofran, and encouraged hydration.  At this time, low suspicion for intra-abdominal infection, perforation or obstruction.  Do not believe she needs CT or admission at this time.  Patient appears safe for discharge.  Return precautions given.  Patient states she understands and agrees to plan.   Final Clinical Impressions(s) / ED Diagnoses   Final diagnoses:  Diarrhea, unspecified type  Shakiness  Dehydration  Hangover without  complication [facility])    ED Discharge Orders         Ordered    ondansetron (ZOFRAN) 4 MG tablet  Every 8 hours PRN     06/29/18 1607           Arlene Genova, PA-C 06/29/18 2119    Lorre Nick, MD 06/30/18 [phone]

## 2018-06-29 NOTE — ED Triage Notes (Signed)
patient states she feels dehydrated today. Patient states she has been having diarrhea and she drank a lot last night ( a bottle of wine) and feels dehydrated.

## 2018-06-29 NOTE — Discharge Instructions (Addendum)
Make sure you are staying well-hydrated water. Use Zofran as needed for nausea or vomiting. Stop drinking alcohol.  There is information about outpatient resources that she can use.  I also put in a consult for peer support who can help you navigate the system after you have been discharged. Return to the emergency room with any new, worsening, concerning symptoms.

## 2018-06-29 NOTE — ED Notes (Signed)
Patient ambulated to restroom with no assistance and no problems.  

## 2018-06-29 NOTE — ED Notes (Signed)
Patient aware we need urine sample. Patient will call out when ready to void.  

## 2018-06-29 NOTE — ED Notes (Signed)
Patient unable to void at this time. Patient will call out when ready to try.

## 2018-10-27 ENCOUNTER — Emergency Department (HOSPITAL_COMMUNITY)
Admission: EM | Admit: 2018-10-27 | Discharge: 2018-10-28 | Disposition: A | Discharge status: Home / Self Care | Payer: 59 | Attending: Emergency Medicine | Admitting: Emergency Medicine

## 2018-10-27 ENCOUNTER — Encounter (HOSPITAL_COMMUNITY): Payer: Self-pay | Admitting: Family Medicine

## 2018-10-27 DIAGNOSIS — F1210 Cannabis abuse, uncomplicated: Secondary | ICD-10-CM | POA: Insufficient documentation

## 2018-10-27 DIAGNOSIS — R112 Nausea with vomiting, unspecified: Secondary | ICD-10-CM | POA: Diagnosis present

## 2018-10-27 DIAGNOSIS — F1020 Alcohol dependence, uncomplicated: Secondary | ICD-10-CM

## 2018-10-27 DIAGNOSIS — R531 Weakness: Secondary | ICD-10-CM | POA: Insufficient documentation

## 2018-10-27 LAB — CBC WITH DIFFERENTIAL/PLATELET
Abs Immature Granulocytes: 0.01 10*3/uL (ref 0.00–0.07)
Basophils Absolute: 0 10*3/uL (ref 0.0–0.1)
Basophils Relative: 1 %
Eosinophils Absolute: 0 10*3/uL (ref 0.0–0.5)
Eosinophils Relative: 0 %
HCT: 36.3 % (ref 36.0–46.0)
Hemoglobin: 12 g/dL (ref 12.0–15.0)
Immature Granulocytes: 0 %
Lymphocytes Relative: 25 %
Lymphs Abs: 1 10*3/uL (ref 0.7–4.0)
MCH: 32.2 pg (ref 26.0–34.0)
MCHC: 33.1 g/dL (ref 30.0–36.0)
MCV: 97.3 fL (ref 80.0–100.0)
Monocytes Absolute: 0.5 10*3/uL (ref 0.1–1.0)
Monocytes Relative: 12 %
Neutro Abs: 2.5 10*3/uL (ref 1.7–7.7)
Neutrophils Relative %: 62 %
Platelets: 306 10*3/uL (ref 150–400)
RBC: 3.73 MIL/uL — ABNORMAL LOW (ref 3.87–5.11)
RDW: 13.2 % (ref 11.5–15.5)
WBC: 4 10*3/uL (ref 4.0–10.5)
nRBC: 0 % (ref 0.0–0.2)

## 2018-10-27 LAB — COMPREHENSIVE METABOLIC PANEL
ALT: 28 U/L (ref 0–44)
AST: 35 U/L (ref 15–41)
Albumin: 4.2 g/dL (ref 3.5–5.0)
Alkaline Phosphatase: 63 U/L (ref 38–126)
Anion gap: 10 (ref 5–15)
BUN: 10 mg/dL (ref 6–20)
CO2: 23 mmol/L (ref 22–32)
Calcium: 9.2 mg/dL (ref 8.9–10.3)
Chloride: 102 mmol/L (ref 98–111)
Creatinine, Ser: 0.87 mg/dL (ref 0.44–1.00)
GFR calc Af Amer: >60 mL/min (ref >60)
GFR calc non Af Amer: >60 mL/min (ref >60)
Glucose, Bld: 128 mg/dL — ABNORMAL HIGH (ref 70–99)
Potassium: 3.8 mmol/L (ref 3.5–5.1)
Sodium: 135 mmol/L (ref 135–145)
Total Bilirubin: 1 mg/dL (ref 0.3–1.2)
Total Protein: 7.5 g/dL (ref 6.5–8.1)

## 2018-10-27 LAB — ETHANOL: Alcohol, Ethyl (B): <10 mg/dL (ref <10)

## 2018-10-27 MED ORDER — ONDANSETRON HCL 4 MG/2ML IJ SOLN
4.0000 mg | Freq: Once | INTRAMUSCULAR | Status: AC
  Administered 2018-10-27: 4 mg via INTRAVENOUS
  Filled 2018-10-27: qty 2

## 2018-10-27 MED ORDER — LORAZEPAM 1 MG PO TABS: 0.0000 mg | ORAL_TABLET | Freq: Four times a day (QID) | ORAL | Status: DC

## 2018-10-27 MED ORDER — LORAZEPAM 2 MG/ML IJ SOLN: 0.0000 mg | Freq: Two times a day (BID) | INTRAMUSCULAR | Status: DC

## 2018-10-27 MED ORDER — LORAZEPAM 2 MG/ML IJ SOLN
0.0000 mg | Freq: Four times a day (QID) | INTRAMUSCULAR | Status: DC
  Administered 2018-10-27: 1 mg via INTRAVENOUS
  Filled 2018-10-27: qty 1

## 2018-10-27 MED ORDER — SODIUM CHLORIDE 0.9 % IV BOLUS
2000.0000 mL | Freq: Once | INTRAVENOUS | Status: AC
  Administered 2018-10-27: 23:00:00 2000 mL via INTRAVENOUS

## 2018-10-27 MED ORDER — THIAMINE HCL 100 MG/ML IJ SOLN: 100.0000 mg | Freq: Every day | INTRAMUSCULAR | Status: DC

## 2018-10-27 MED ORDER — VITAMIN B-1 100 MG PO TABS: 100.0000 mg | ORAL_TABLET | Freq: Every day | ORAL | Status: DC

## 2018-10-27 MED ORDER — LORAZEPAM 1 MG PO TABS: 0.0000 mg | ORAL_TABLET | Freq: Two times a day (BID) | ORAL | Status: DC

## 2018-10-27 NOTE — ED Provider Notes (Signed)
[facility]-EMERGENCY DEPT Provider Note   CSN: [id number] Arrival date & time: 10/27/18  2145    History   Chief Complaint Chief Complaint  Patient presents with  . Detox     HPI Diana Espinoza is a 38 y.o. female.     Patient to ED with complaint of nausea and vomiting all day today. She feels she may be going into alcohol withdrawal as she normally drinks a bottle of wine daily and did not drink today. No hematemesis. She is having diarrhea but reports this is chronic and unchanged. She has a history of pancreatitis but denies abdominal pain currently. She denies SI/HI, no hallucinations. No tremor. She feels generally weak and feels she can't concentrate or focus as usual. No recent symptoms of illness - no cough, fever, congestion.   The history is provided by the patient. No language interpreter was used.    Past Medical History:  Diagnosis Date  . Abnormal Pap smear   . Anemia   . Anxiety    on meds sometimes. Coping effectively  . Depression   . Pancreatitis     Patient Active Problem List   Diagnosis Date Noted  . Pancreatitis     Past Surgical History:  Procedure Laterality Date  . CHOLECYSTECTOMY    . ECTOPIC PREGNANCY SURGERY  2000  . TONSILLECTOMY       OB History    Gravida  5   Para      Term      Preterm      AB  2   Living  3     SAB      TAB  1   Ectopic  1   Multiple      Live Births               Home Medications    Prior to Admission medications   Medication Sig Start Date End Date Taking? Authorizing Provider  Aspirin-Salicylamide-Caffeine (BC HEADACHE POWDER PO) Take 1 packet by mouth daily as needed (pain).    [provider]  cyclobenzaprine (FLEXERIL) 10 MG tablet Take 1 tablet (10 mg total) by mouth 3 (three) times daily as needed for muscle spasms. Patient not taking: Reported on 06/29/2018 09/16/17   Azalia Bilisampos, Kevin, MD  hydrOXYzine (ATARAX/VISTARIL) 25 MG tablet Take 1 tablet (25  mg total) by mouth every 8 (eight) hours as needed. Patient not taking: Reported on 06/29/2018 05/30/18   Janne NapoleonNeese, Hope M, NP  lidocaine (XYLOCAINE) 2 % solution Use as directed 10 mLs in the mouth or throat as needed for mouth pain. Patient not taking: Reported on 06/29/2018 05/30/18   Janne NapoleonNeese, Hope M, NP  naproxen (NAPROSYN) 375 MG tablet Take 1 tablet (375 mg total) by mouth 2 (two) times daily. Patient not taking: Reported on 06/29/2018 05/30/18   Janne NapoleonNeese, Hope M, NP  ondansetron (ZOFRAN) 4 MG tablet Take 1 tablet (4 mg total) by mouth every 8 (eight) hours as needed for nausea or vomiting. 06/29/18   Caccavale, Sophia, PA-C  pantoprazole (PROTONIX) 20 MG tablet Take 1 tablet (20 mg total) by mouth 2 (two) times daily before a meal. Patient not taking: Reported on 06/29/2018 10/05/16   Gilda CreasePollina, Christopher J, MD  penicillin v potassium (VEETID) 500 MG tablet Take 1 tablet (500 mg total) by mouth 3 (three) times daily. Patient not taking: Reported on 06/29/2018 09/16/17   Azalia Bilisampos, Kevin, MD  sucralfate (CARAFATE) 1 GM/10ML suspension Take 10 mLs (1 g  total) by mouth 4 (four) times daily -  with meals and at bedtime. Patient not taking: Reported on 06/29/2018 10/05/16   [name], MD    Family History Family History  Problem Relation Age of Onset  . Diabetes Mother   . Hypertension Other   . Cancer Other   . Pancreatitis Other   . Anesthesia problems Neg Hx     Social History Social History   Tobacco Use  . Smoking status: Never Smoker  . Smokeless tobacco: Never Used  Substance Use Topics  . Alcohol use: Yes    Comment: Daily   . Drug use: Yes    Types: Marijuana    Comment: Lasted: 2-3 weeks ago      Allergies   Patient has no known allergies.   Review of Systems Review of Systems  Constitutional: Negative for chills and fever.  HENT: Negative.   Respiratory: Negative.  Negative for cough and shortness of breath.   Cardiovascular: Negative.  Negative for chest pain.   Gastrointestinal: Positive for diarrhea, nausea and vomiting. Negative for abdominal pain.  Musculoskeletal: Negative.   Skin: Negative.   Neurological: Negative.  Negative for tremors and headaches.  Psychiatric/Behavioral: Positive for decreased concentration. Negative for hallucinations and suicidal ideas.     Physical Exam Updated Vital Signs BP (!) 135/93 (BP Location: Left Arm)   Pulse 81   Temp 98.8 F (37.1 C) (Oral)   Resp 20   LMP 10/16/2018   SpO2 100%   Physical Exam Vitals signs and nursing note reviewed.  Constitutional:      Appearance: She is well-developed.  HENT:     Head: Normocephalic.  Neck:     Musculoskeletal: Normal range of motion and neck supple.  Cardiovascular:     Rate and Rhythm: Normal rate and regular rhythm.     Heart sounds: No murmur.  Pulmonary:     Effort: Pulmonary effort is normal.     Breath sounds: Normal breath sounds. No wheezing, rhonchi or rales.  Abdominal:     General: Bowel sounds are normal.     Palpations: Abdomen is soft.     Tenderness: There is no abdominal tenderness. There is no guarding or rebound.  Musculoskeletal: Normal range of motion.  Skin:    General: Skin is warm and dry.     Findings: No rash.  Neurological:     General: No focal deficit present.     Mental Status: She is alert and oriented to person, place, and time.     Comments: No tremors present. She is alert, oriented and cognizant of her surroundings. She follows command and is tracking well.       ED Treatments / Results  Labs (all labs ordered are listed, but only abnormal results are displayed) Labs Reviewed - No data to display  EKG None  Radiology No results found.  Procedures Procedures (including critical care time)  Medications Ordered in ED Medications  sodium chloride 0.9 % bolus 2,000 mL (has no administration in time range)  ondansetron (ZOFRAN) injection 4 mg (has no administration in time range)  LORazepam (ATIVAN)  injection 0-4 mg (has no administration in time range)    Or  LORazepam (ATIVAN) tablet 0-4 mg (has no administration in time range)  LORazepam (ATIVAN) injection 0-4 mg (has no administration in time range)    Or  LORazepam (ATIVAN) tablet 0-4 mg (has no administration in time range)  thiamine (VITAMIN B-1) tablet 100 mg (has no  administration in time range)    Or  thiamine (B-1) injection 100 mg (has no administration in time range)     Initial Impression / Assessment and Plan / ED Course  I have reviewed the triage vital signs and the nursing notes.  Pertinent labs & imaging results that were available during my care of the patient were reviewed by me and considered in my medical decision making (see chart for details).        Patient to ED with symptoms of nausea and vomiting, unable to tolerate anything today by mouth. She is trying to stop drinking alcohol and today is the first day she has not drank. She states she drinks one bottle daily, last intake was last night.   There is no tremor. No tachycardia, agitation. Suspect she is significantly dehydrated. Will check basic labs, provide IV fluids, Zofran. CIWA protocol started.   On re-evaluation, the patient is feeling much better after IVF's. She feels her focus and concentration are baseline. No nausea.   Discussed referrals for treatment programs and Rx for Librium. Patient is comfortable with plan of discharge.   Final Clinical Impressions(s) / ED Diagnoses   Final diagnoses:  None   1. Alcohol dependence  ED Discharge Orders    None       Elpidio Anis, PA-C 10/28/18 2956    Tegeler, Canary Brim, MD 10/28/18 [phone]

## 2018-10-27 NOTE — ED Triage Notes (Signed)
Patient is from home and transported via [facility]. Patient is complaining of nausea, vomiting, all day from ETOH detox. Patient normally drinks 1 bottle of of wine a day. Last drinkL yesterday evening.

## 2018-10-28 MED ORDER — ONDANSETRON 4 MG PO TBDP: 4.0000 mg | ORAL_TABLET | Freq: Three times a day (TID) | ORAL | 0 refills | Status: DC | PRN

## 2018-10-28 MED ORDER — CHLORDIAZEPOXIDE HCL 25 MG PO CAPS: ORAL_CAPSULE | ORAL | 0 refills | Status: DC

## 2018-10-28 NOTE — Discharge Instructions (Signed)
Take Zofran as needed for any further nausea. Take Librium as prescribed to help with stopping alcohol use.   Please return to the ED with any new or concerning symptoms.

## 2018-12-07 ENCOUNTER — Other Ambulatory Visit: Payer: Self-pay

## 2018-12-07 ENCOUNTER — Encounter (HOSPITAL_COMMUNITY): Payer: Self-pay | Admitting: Emergency Medicine

## 2018-12-07 ENCOUNTER — Emergency Department (HOSPITAL_COMMUNITY)
Admission: EM | Admit: 2018-12-07 | Discharge: 2018-12-07 | Disposition: A | Discharge status: Home / Self Care | Payer: 59 | Attending: Emergency Medicine | Admitting: Emergency Medicine

## 2018-12-07 DIAGNOSIS — F329 Major depressive disorder, single episode, unspecified: Secondary | ICD-10-CM

## 2018-12-07 DIAGNOSIS — F339 Major depressive disorder, recurrent, unspecified: Secondary | ICD-10-CM

## 2018-12-07 DIAGNOSIS — F1020 Alcohol dependence, uncomplicated: Secondary | ICD-10-CM | POA: Insufficient documentation

## 2018-12-07 DIAGNOSIS — F32A Depression, unspecified: Secondary | ICD-10-CM

## 2018-12-07 DIAGNOSIS — F314 Bipolar disorder, current episode depressed, severe, without psychotic features: Secondary | ICD-10-CM | POA: Insufficient documentation

## 2018-12-07 DIAGNOSIS — R45851 Suicidal ideations: Secondary | ICD-10-CM

## 2018-12-07 DIAGNOSIS — Z03818 Encounter for observation for suspected exposure to other biological agents ruled out: Secondary | ICD-10-CM | POA: Diagnosis not present

## 2018-12-07 DIAGNOSIS — Z79899 Other long term (current) drug therapy: Secondary | ICD-10-CM | POA: Diagnosis not present

## 2018-12-07 DIAGNOSIS — F10920 Alcohol use, unspecified with intoxication, uncomplicated: Secondary | ICD-10-CM

## 2018-12-07 DIAGNOSIS — F1010 Alcohol abuse, uncomplicated: Secondary | ICD-10-CM

## 2018-12-07 LAB — CBC WITH DIFFERENTIAL/PLATELET
Abs Immature Granulocytes: 0.01 10*3/uL (ref 0.00–0.07)
Basophils Absolute: 0 10*3/uL (ref 0.0–0.1)
Basophils Relative: 1 %
Eosinophils Absolute: 0 10*3/uL (ref 0.0–0.5)
Eosinophils Relative: 0 %
HCT: 42.7 % (ref 36.0–46.0)
Hemoglobin: 13.5 g/dL (ref 12.0–15.0)
Immature Granulocytes: 0 %
Lymphocytes Relative: 54 %
Lymphs Abs: 3.3 10*3/uL (ref 0.7–4.0)
MCH: 30.9 pg (ref 26.0–34.0)
MCHC: 31.6 g/dL (ref 30.0–36.0)
MCV: 97.7 fL (ref 80.0–100.0)
Monocytes Absolute: 0.4 10*3/uL (ref 0.1–1.0)
Monocytes Relative: 6 %
Neutro Abs: 2.4 10*3/uL (ref 1.7–7.7)
Neutrophils Relative %: 39 %
Platelets: 408 10*3/uL — ABNORMAL HIGH (ref 150–400)
RBC: 4.37 MIL/uL (ref 3.87–5.11)
RDW: 13.3 % (ref 11.5–15.5)
WBC: 6.1 10*3/uL (ref 4.0–10.5)
nRBC: 0 % (ref 0.0–0.2)

## 2018-12-07 LAB — RAPID URINE DRUG SCREEN, HOSP PERFORMED
Amphetamines: NOT DETECTED
Barbiturates: NOT DETECTED
Benzodiazepines: NOT DETECTED
Cocaine: NOT DETECTED
Opiates: NOT DETECTED
Tetrahydrocannabinol: NOT DETECTED

## 2018-12-07 LAB — COMPREHENSIVE METABOLIC PANEL
ALT: 24 U/L (ref 0–44)
AST: 32 U/L (ref 15–41)
Albumin: 4.2 g/dL (ref 3.5–5.0)
Alkaline Phosphatase: 65 U/L (ref 38–126)
Anion gap: 14 (ref 5–15)
BUN: 13 mg/dL (ref 6–20)
CO2: 22 mmol/L (ref 22–32)
Calcium: 8.5 mg/dL — ABNORMAL LOW (ref 8.9–10.3)
Chloride: 109 mmol/L (ref 98–111)
Creatinine, Ser: 0.66 mg/dL (ref 0.44–1.00)
GFR calc Af Amer: >60 mL/min (ref >60)
GFR calc non Af Amer: >60 mL/min (ref >60)
Glucose, Bld: 94 mg/dL (ref 70–99)
Potassium: 3.1 mmol/L — ABNORMAL LOW (ref 3.5–5.1)
Sodium: 145 mmol/L (ref 135–145)
Total Bilirubin: 0.4 mg/dL (ref 0.3–1.2)
Total Protein: 8.1 g/dL (ref 6.5–8.1)

## 2018-12-07 LAB — ETHANOL: Alcohol, Ethyl (B): 353 mg/dL (ref <10)

## 2018-12-07 LAB — I-STAT BETA HCG BLOOD, ED (MC, WL, AP ONLY): I-stat hCG, quantitative: <5 m[IU]/mL (ref <5)

## 2018-12-07 LAB — SARS CORONAVIRUS 2 BY RT PCR (HOSPITAL ORDER, PERFORMED IN ~~LOC~~ HOSPITAL LAB): SARS Coronavirus 2: NEGATIVE

## 2018-12-07 LAB — LITHIUM LEVEL: Lithium Lvl: <0.06 mmol/L — ABNORMAL LOW (ref 0.60–1.20)

## 2018-12-07 MED ORDER — LITHIUM CARBONATE ER 450 MG PO TBCR: 450.0000 mg | EXTENDED_RELEASE_TABLET | Freq: Every evening | ORAL | Status: DC

## 2018-12-07 MED ORDER — LORAZEPAM 2 MG/ML IJ SOLN: 0.0000 mg | Freq: Four times a day (QID) | INTRAMUSCULAR | Status: DC

## 2018-12-07 MED ORDER — ACETAMINOPHEN 325 MG PO TABS: 650.0000 mg | ORAL_TABLET | ORAL | Status: DC | PRN

## 2018-12-07 MED ORDER — THIAMINE HCL 100 MG/ML IJ SOLN: 100.0000 mg | Freq: Every day | INTRAMUSCULAR | Status: DC

## 2018-12-07 MED ORDER — LORAZEPAM 2 MG/ML IJ SOLN: 0.0000 mg | Freq: Two times a day (BID) | INTRAMUSCULAR | Status: DC

## 2018-12-07 MED ORDER — ONDANSETRON HCL 4 MG PO TABS: 4.0000 mg | ORAL_TABLET | Freq: Three times a day (TID) | ORAL | Status: DC | PRN

## 2018-12-07 MED ORDER — LORAZEPAM 1 MG PO TABS: 0.0000 mg | ORAL_TABLET | Freq: Two times a day (BID) | ORAL | Status: DC

## 2018-12-07 MED ORDER — HYDROXYZINE HCL 25 MG PO TABS: 25.0000 mg | ORAL_TABLET | Freq: Three times a day (TID) | ORAL | Status: DC | PRN

## 2018-12-07 MED ORDER — VITAMIN B-1 100 MG PO TABS: 100.0000 mg | ORAL_TABLET | Freq: Every day | ORAL | Status: DC

## 2018-12-07 MED ORDER — LORAZEPAM 1 MG PO TABS: 0.0000 mg | ORAL_TABLET | Freq: Four times a day (QID) | ORAL | Status: DC

## 2018-12-07 MED ORDER — ONDANSETRON 4 MG PO TBDP: 4.0000 mg | ORAL_TABLET | Freq: Three times a day (TID) | ORAL | Status: DC | PRN

## 2018-12-07 NOTE — ED Provider Notes (Signed)
[facility]-EMERGENCY DEPT Provider Note   CSN: [id number] Arrival date & time: 12/07/18  0205     History   Chief Complaint Chief Complaint  Patient presents with  . Alcohol Intoxication  . Depression    HPI [name] is a 38 y.o. female.     HPI  38 year old female with history of alcoholism comes in a chief complaint of SI.  Besides alcoholism patient has history of depression /bipolar disorder.  Patient states that she had been sober for a month, but relapsed today.  While intoxicated she texted something to her sister which led to patient being brought to the ER.  I spoke with patient's Sister Diana Espinoza, who informed me that patient has gone through a lot and is suffering through alcoholism.  She is not quite sure if patient has been sober for the last several days, but states that patient has been texting her off and on that she wants to kill herself, including a text stating the same earlier today.  She thinks that patient needs psychiatry evaluation.   Past Medical History:  Diagnosis Date  . Abnormal Pap smear   . Anemia   . Anxiety    on meds sometimes. Coping effectively  . Depression   . Pancreatitis     Patient Active Problem List   Diagnosis Date Noted  . Pancreatitis     Past Surgical History:  Procedure Laterality Date  . CHOLECYSTECTOMY    . ECTOPIC PREGNANCY SURGERY  2000  . TONSILLECTOMY       OB History    Gravida  5   Para      Term      Preterm      AB  2   Living  3     SAB      TAB  1   Ectopic  1   Multiple      Live Births               Home Medications    Prior to Admission medications   Medication Sig Start Date End Date Taking? Authorizing Provider  ALPRAZolam Prudy Feeler(XANAX) 1 MG tablet Take 1 mg by mouth 4 (four) times daily as needed for anxiety.  11/10/18  Yes [provider]  Aspirin-Salicylamide-Caffeine (BC HEADACHE POWDER PO) Take 1 packet by mouth daily as needed (pain).    Yes [provider]  lithium carbonate (ESKALITH) 450 MG CR tablet Take 450 mg by mouth every evening. 11/10/18  Yes [provider]  chlordiazePOXIDE (LIBRIUM) 25 MG capsule 50mg  PO TID x 1D, then 25-50mg  PO BID X 1D, then 25-50mg  PO QD X 1D Patient not taking: Reported on 12/07/2018 10/28/18   Elpidio AnisUpstill, Shari, PA-C  hydrOXYzine (ATARAX/VISTARIL) 25 MG tablet Take 1 tablet (25 mg total) by mouth every 8 (eight) hours as needed. Patient not taking: Reported on 06/29/2018 05/30/18   Janne NapoleonNeese, Hope M, NP  lidocaine (XYLOCAINE) 2 % solution Use as directed 10 mLs in the mouth or throat as needed for mouth pain. Patient not taking: Reported on 06/29/2018 05/30/18   Janne NapoleonNeese, Hope M, NP  naproxen (NAPROSYN) 375 MG tablet Take 1 tablet (375 mg total) by mouth 2 (two) times daily. Patient not taking: Reported on 06/29/2018 05/30/18   Janne NapoleonNeese, Hope M, NP  ondansetron (ZOFRAN ODT) 4 MG disintegrating tablet Take 1 tablet (4 mg total) by mouth every 8 (eight) hours as needed for nausea or vomiting. Patient not taking: Reported on 12/07/2018  10/28/18   Charlann Lange, PA-C  ondansetron (ZOFRAN) 4 MG tablet Take 1 tablet (4 mg total) by mouth every 8 (eight) hours as needed for nausea or vomiting. Patient not taking: Reported on 12/07/2018 06/29/18   Franchot Heidelberg, PA-C    Family History Family History  Problem Relation Age of Onset  . Diabetes Mother   . Hypertension Other   . Cancer Other   . Pancreatitis Other   . Anesthesia problems Neg Hx     Social History Social History   Tobacco Use  . Smoking status: Never Smoker  . Smokeless tobacco: Never Used  Substance Use Topics  . Alcohol use: Yes    Comment: Daily   . Drug use: Not Currently    Types: Marijuana    Comment: Lasted: 2-3 weeks ago      Allergies   Patient has no known allergies.   Review of Systems Review of Systems  Constitutional: Positive for activity change.  Respiratory: Negative for shortness of breath.    Cardiovascular: Negative for chest pain.  Gastrointestinal: Negative for nausea and vomiting.  Allergic/Immunologic: Negative for immunocompromised state.  Hematological: Does not bruise/bleed easily.  Psychiatric/Behavioral: Positive for dysphoric mood and suicidal ideas.  All other systems reviewed and are negative.    Physical Exam Updated Vital Signs BP 96/60   Pulse 83   Temp 98.2 F (36.8 C) (Oral)   Resp 14   Ht 5\' 3"  (1.6 m)   Wt 72.6 kg   LMP 11/16/2018   SpO2 100%   BMI 28.34 kg/m   Physical Exam Vitals signs and nursing note reviewed.  Constitutional:      Appearance: She is well-developed.  HENT:     Head: Normocephalic and atraumatic.  Neck:     Musculoskeletal: Normal range of motion and neck supple.  Cardiovascular:     Rate and Rhythm: Normal rate.  Pulmonary:     Effort: Pulmonary effort is normal.  Abdominal:     General: Bowel sounds are normal.  Skin:    General: Skin is warm and dry.  Neurological:     Mental Status: She is alert and oriented to person, place, and time.      ED Treatments / Results  Labs (all labs ordered are listed, but only abnormal results are displayed) Labs Reviewed  CBC WITH DIFFERENTIAL/PLATELET - Abnormal; Notable for the following components:      Result Value   Platelets 408 (*)    All other components within normal limits  SARS CORONAVIRUS 2 (HOSPITAL ORDER, [place] [facility])  COMPREHENSIVE METABOLIC PANEL  ETHANOL  RAPID URINE DRUG SCREEN, HOSP PERFORMED  LITHIUM LEVEL  I-STAT BETA HCG BLOOD, ED ([facility], [facility], [facility] ONLY)    EKG    Radiology No results found.  Procedures Procedures (including critical care time)  Medications Ordered in ED Medications - No data to display   Initial Impression / Assessment and Plan / ED Course  I have reviewed the triage vital signs and the nursing notes.  Pertinent labs & imaging results that were available during my care of the patient were  reviewed by me and considered in my medical decision making (see chart for details).        38 year old female with history of bipolar disorder and heavy alcohol use comes in the request of her sister for evaluation for SI. Patient admits to heavy alcohol use.  She states that she might have texted something while she was intoxicated.  She does also admit to feeling depressed and having PTSD-like symptoms given her rough past.  At this time she is AO x3. We will check basic labs, but essentially she is medically cleared for psychiatric evaluation.   Final Clinical Impressions(s) / ED Diagnoses   Final diagnoses:  Alcohol abuse  Alcoholic intoxication without complication [facility](HCC)  Suicidal ideations    ED Discharge Orders    None       Derwood KaplanNanavati, Danyell Shader, MD 12/07/18 [id number]

## 2018-12-07 NOTE — ED Notes (Signed)
Patient assessment recommends that psychiatry evaluate in the AM when she is sober/ alcohol level is down.
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
## 2018-12-07 NOTE — ED Notes (Signed)
Critical lab: alcohol 353

## 2018-12-07 NOTE — ED Notes (Signed)
Patient's belonging's taken and secured in patient belonging bag behind nurse's station. Patient called sister and gave her an update, phone and charger taken and secured with belongings.
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
## 2018-12-07 NOTE — ED Notes (Signed)
Sister, Maree Erie, would like to be contacted with updates at [phone]

## 2018-12-07 NOTE — BH Assessment (Signed)
[facility] Assessment Progress Note  Per Buford Dresser, DO, this pt does not require psychiatric hospitalization at this time.  Pt is to be discharged from [facility] with recommendation to continue treatment with Dr Toy Care and Dr Debroah Loop.  She is also to be provided with referral information for area chemical dependency intensive outpatient programs in the area.  These have been included in pt's discharge instructions.  Pt's nurse has been notified.  Jalene Mullet, [place] Triage Specialist [phone]

## 2018-12-07 NOTE — Discharge Instructions (Addendum)
For your behavioral health needs, you are advised to continue treatment with your current outpatient providers, Chucky May, MD and Dr Debroah Loop:       Chucky May, MD      [address]      [place], [place] [address]      [phone]  To help you maintain a sober lifestyle, a substance abuse treatment program may be beneficial to you.  The providers below offer chemical dependency intensive outpatient programs (CD-IOP).  These programs meet several hours a day, several times a week.  Contact them at your earliest opportunity to ask about enrolling:       [facility]. [address] [place], [place] [address]      Contact person: Brandon Melnick      [phone]       [facility]      [address] [place], [place] [address]      [phone]

## 2018-12-07 NOTE — Patient Outreach (Signed)
ED Peer Support Specialist Patient Intake (Complete at intake & 30-60 Day Follow-up)  Name: Diana Espinoza  MRN: [id number]  Age: 38 y.o.   Date of Admission: 12/07/2018  Intake: Initial Comments:      Primary Reason Admitted: Alcohol Intoxication, Depression   Lab values: Alcohol/ETOH: Positive Positive UDS? No Amphetamines: No Barbiturates: No Benzodiazepines: No Cocaine: No Opiates: No Cannabinoids: No  Demographic information: Gender: Female Ethnicity: African American Marital Status: Single Insurance Status: Diplomatic Services operational officer (Work Neurosurgeon, Physicist, medical, etc.: No Lives with: Partner/Spouse Living situation: House/Apartment  Reported Patient History: Patient reported health conditions: Anxiety disorders Patient aware of HIV and hepatitis status: No  In past year, has patient visited ED for any reason? Yes  Number of ED visits: 2  Reason(s) for visit: Same situation  In past year, has patient been hospitalized for any reason? No  Number of hospitalizations:    Reason(s) for hospitalization:    In past year, has patient been arrested? No  Number of arrests:    Reason(s) for arrest:    In past year, has patient been incarcerated? No  Number of incarcerations:    Reason(s) for incarceration:    In past year, has patient received medication-assisted treatment? No  In past year, patient received the following treatments: Other (comment)  In past year, has patient received any harm reduction services? No  Did this include any of the following?    In past year, has patient received care from a mental health provider for diagnosis other than SUD? No  In past year, is this first time patient has overdosed? No  Number of past overdoses:    In past year, is this first time patient has been hospitalized for an overdose? No  Number of hospitalizations for overdose(s):    Is patient currently receiving treatment  for a mental health diagnosis? No  Patient reports experiencing difficulty participating in SUD treatment: No    Most important reason(s) for this difficulty?    Has patient received prior services for treatment? No  In past, patient has received services from following agencies:    Plan of Care:  Suggested follow up at these agencies/treatment centers: Individual therapy(OutPatient Phone service)  Other information: CPSS met with Pt and was made aware that Pt wants help with the Alcohol use. Pt stated that because she has children and work from home she wants to look into Outpatient services or services by Phone. CPSS gave Pt [facility] information and AA meetings by Phone information also.     Aaron Edelman Taisa Deloria, CPSS  12/07/2018 10:55 AM

## 2018-12-07 NOTE — Progress Notes (Signed)
CSW called and spoke to patient's sister, Sheera Illingworth, who is in agreement with patient's discharge and feels she will be safe at home.  Sister asked that patient be given referral information for Substance Use treatment at [facility] and a letter noting that she was seen in the ED today so that she can give it to her employer.  CSW will complete Employer excuse note .    Areatha Keas. Judi Cong, MSW, [place] Disposition Clinical Social Work [phone] (cell) [phone] (office)

## 2019-09-02 ENCOUNTER — Encounter (HOSPITAL_COMMUNITY): Payer: Self-pay | Admitting: Emergency Medicine

## 2019-09-02 ENCOUNTER — Other Ambulatory Visit: Payer: Self-pay

## 2019-09-02 ENCOUNTER — Emergency Department (HOSPITAL_COMMUNITY)
Admission: EM | Admit: 2019-09-02 | Discharge: 2019-09-02 | Disposition: A | Discharge status: Home / Self Care | Payer: 59 | Attending: Emergency Medicine | Admitting: Emergency Medicine

## 2019-09-02 DIAGNOSIS — M25511 Pain in right shoulder: Secondary | ICD-10-CM

## 2019-09-02 DIAGNOSIS — F1210 Cannabis abuse, uncomplicated: Secondary | ICD-10-CM | POA: Diagnosis not present

## 2019-09-02 DIAGNOSIS — Z79899 Other long term (current) drug therapy: Secondary | ICD-10-CM | POA: Insufficient documentation

## 2019-09-02 DIAGNOSIS — M542 Cervicalgia: Secondary | ICD-10-CM | POA: Diagnosis not present

## 2019-09-02 LAB — POC URINE PREG, ED: Preg Test, Ur: NEGATIVE

## 2019-09-02 MED ORDER — METHOCARBAMOL 500 MG PO TABS: 500.0000 mg | ORAL_TABLET | Freq: Two times a day (BID) | ORAL | 0 refills | Status: DC

## 2019-09-02 MED ORDER — LIDOCAINE 5 % EX PTCH
2.0000 | MEDICATED_PATCH | CUTANEOUS | Status: DC
  Administered 2019-09-02: 10:00:00 2 via TRANSDERMAL
  Filled 2019-09-02: qty 2

## 2019-09-02 MED ORDER — NAPROXEN 500 MG PO TABS: 500.0000 mg | ORAL_TABLET | Freq: Two times a day (BID) | ORAL | 0 refills | Status: DC
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
## 2019-10-13 ENCOUNTER — Encounter (HOSPITAL_COMMUNITY): Payer: Self-pay

## 2019-10-13 ENCOUNTER — Emergency Department (HOSPITAL_COMMUNITY)
Admission: EM | Admit: 2019-10-13 | Discharge: 2019-10-13 | Disposition: A | Discharge status: Home / Self Care | Payer: 59 | Attending: Emergency Medicine | Admitting: Emergency Medicine

## 2019-10-13 ENCOUNTER — Other Ambulatory Visit: Payer: Self-pay

## 2019-10-13 DIAGNOSIS — F10230 Alcohol dependence with withdrawal, uncomplicated: Secondary | ICD-10-CM | POA: Insufficient documentation

## 2019-10-13 DIAGNOSIS — Z79899 Other long term (current) drug therapy: Secondary | ICD-10-CM | POA: Insufficient documentation

## 2019-10-13 DIAGNOSIS — F10930 Alcohol use, unspecified with withdrawal, uncomplicated: Secondary | ICD-10-CM

## 2019-10-13 LAB — CBC WITH DIFFERENTIAL/PLATELET
Abs Immature Granulocytes: 0.01 10*3/uL (ref 0.00–0.07)
Basophils Absolute: 0 10*3/uL (ref 0.0–0.1)
Basophils Relative: 1 %
Eosinophils Absolute: 0 10*3/uL (ref 0.0–0.5)
Eosinophils Relative: 1 %
HCT: 40.1 % (ref 36.0–46.0)
Hemoglobin: 12.8 g/dL (ref 12.0–15.0)
Immature Granulocytes: 0 %
Lymphocytes Relative: 37 %
Lymphs Abs: 1.2 10*3/uL (ref 0.7–4.0)
MCH: 31.7 pg (ref 26.0–34.0)
MCHC: 31.9 g/dL (ref 30.0–36.0)
MCV: 99.3 fL (ref 80.0–100.0)
Monocytes Absolute: 0.3 10*3/uL (ref 0.1–1.0)
Monocytes Relative: 9 %
Neutro Abs: 1.6 10*3/uL — ABNORMAL LOW (ref 1.7–7.7)
Neutrophils Relative %: 52 %
Platelets: 221 10*3/uL (ref 150–400)
RBC: 4.04 MIL/uL (ref 3.87–5.11)
RDW: 13.3 % (ref 11.5–15.5)
WBC: 3.1 10*3/uL — ABNORMAL LOW (ref 4.0–10.5)
nRBC: 0 % (ref 0.0–0.2)

## 2019-10-13 LAB — COMPREHENSIVE METABOLIC PANEL
ALT: 52 U/L — ABNORMAL HIGH (ref 0–44)
AST: 59 U/L — ABNORMAL HIGH (ref 15–41)
Albumin: 4.5 g/dL (ref 3.5–5.0)
Alkaline Phosphatase: 66 U/L (ref 38–126)
Anion gap: 14 (ref 5–15)
BUN: 8 mg/dL (ref 6–20)
CO2: 23 mmol/L (ref 22–32)
Calcium: 9.9 mg/dL (ref 8.9–10.3)
Chloride: 99 mmol/L (ref 98–111)
Creatinine, Ser: 0.77 mg/dL (ref 0.44–1.00)
GFR calc Af Amer: >60 mL/min (ref >60)
GFR calc non Af Amer: >60 mL/min (ref >60)
Glucose, Bld: 98 mg/dL (ref 70–99)
Potassium: 4.4 mmol/L (ref 3.5–5.1)
Sodium: 136 mmol/L (ref 135–145)
Total Bilirubin: 1 mg/dL (ref 0.3–1.2)
Total Protein: 8.5 g/dL — ABNORMAL HIGH (ref 6.5–8.1)

## 2019-10-13 LAB — URINALYSIS, ROUTINE W REFLEX MICROSCOPIC
Bacteria, UA: NONE SEEN
Bilirubin Urine: NEGATIVE
Glucose, UA: NEGATIVE mg/dL
Ketones, ur: 5 mg/dL — AB
Leukocytes,Ua: NEGATIVE
Nitrite: NEGATIVE
Protein, ur: NEGATIVE mg/dL
Specific Gravity, Urine: 1.008 (ref 1.005–1.030)
pH: 9 — ABNORMAL HIGH (ref 5.0–8.0)

## 2019-10-13 LAB — LIPASE, BLOOD: Lipase: 22 U/L (ref 11–51)

## 2019-10-13 LAB — I-STAT BETA HCG BLOOD, ED (MC, WL, AP ONLY): I-stat hCG, quantitative: <5 m[IU]/mL (ref <5)

## 2019-10-13 MED ORDER — LORAZEPAM 2 MG/ML IJ SOLN: 0.0000 mg | Freq: Four times a day (QID) | INTRAMUSCULAR | Status: DC

## 2019-10-13 MED ORDER — THIAMINE HCL 100 MG PO TABS
100.0000 mg | ORAL_TABLET | Freq: Every day | ORAL | Status: DC
  Administered 2019-10-13: 18:00:00 100 mg via ORAL
  Filled 2019-10-13: qty 1

## 2019-10-13 MED ORDER — CHLORDIAZEPOXIDE HCL 25 MG PO CAPS
ORAL_CAPSULE | ORAL | 0 refills | Status: DC
Start: 2019-10-13 — End: 2020-03-21

## 2019-10-13 MED ORDER — THIAMINE HCL 100 MG/ML IJ SOLN: 100.0000 mg | Freq: Every day | INTRAMUSCULAR | Status: DC

## 2019-10-13 MED ORDER — ONDANSETRON HCL 4 MG/2ML IJ SOLN
4.0000 mg | Freq: Once | INTRAMUSCULAR | Status: AC
  Administered 2019-10-13: 4 mg via INTRAVENOUS
  Filled 2019-10-13: qty 2

## 2019-10-13 MED ORDER — LORAZEPAM 1 MG PO TABS
0.0000 mg | ORAL_TABLET | Freq: Four times a day (QID) | ORAL | Status: DC
  Administered 2019-10-13: 18:00:00 1 mg via ORAL
  Filled 2019-10-13: qty 1

## 2019-10-13 MED ORDER — LORAZEPAM 1 MG PO TABS: 0.0000 mg | ORAL_TABLET | Freq: Two times a day (BID) | ORAL | Status: DC

## 2019-10-13 MED ORDER — LORAZEPAM 2 MG/ML IJ SOLN: 0.0000 mg | Freq: Two times a day (BID) | INTRAMUSCULAR | Status: DC

## 2019-10-13 MED ORDER — SODIUM CHLORIDE 0.9 % IV BOLUS
1000.0000 mL | Freq: Once | INTRAVENOUS | Status: AC
  Administered 2019-10-13: 1000 mL via INTRAVENOUS

## 2019-10-13 NOTE — ED Triage Notes (Addendum)
Patient reports she thinks she is in alcohol withdrawal Last drink was yesterday at 8pm (Wine). Patient reports drinking for past 5 days.  C/O N/V Emesis X3   Ambulatory in triage A/Ox4

## 2019-10-13 NOTE — ED Notes (Signed)
An After Visit Summary was printed and given to the patient. Discharge instructions given and no further questions at this time.  Pt aware peer support will reach out to her. Pt leaving with fiance.

## 2019-10-13 NOTE — ED Provider Notes (Signed)
[name] Provider Note   CSN: [id number] Arrival date & time: 10/13/19  1631     History Chief Complaint  Patient presents with  . Withdrawal    Diana Espinoza is a 39 y.o. female who presents to the ED today with complaints of possible alcohol withdrawal. Reports that she typically drinks 1 bottle of wine per day. Pt states she drank a sweet wine yesterday which she does not typically drink and began feeling nauseated afterwards. She does mention that she did not drink the full bottle. Approximately 4-5 hours later pt woke up in a cold sweat and felt very jittery. She also reports nausea and multiple episodes of emesis. No abdominal pain. No palpitations. Pt reports being in alcohol withdrawal in the past and this feels very similar. Pt denies chest pain, SOB, hallucinations, or any other associated symptoms.   The history is provided by the patient and medical records.       Past Medical History:  Diagnosis Date  . Abnormal Pap smear   . Anemia   . Anxiety    on meds sometimes. Coping effectively  . Depression   . Pancreatitis     Patient Active Problem List   Diagnosis Date Noted  . Depression 12/07/2018  . Pancreatitis     Past Surgical History:  Procedure Laterality Date  . CHOLECYSTECTOMY    . ECTOPIC PREGNANCY SURGERY  2000  . TONSILLECTOMY       OB History    Gravida  5   Para      Term      Preterm      AB  2   Living  3     SAB      TAB  1   Ectopic  1   Multiple      Live Births              Family History  Problem Relation Age of Onset  . Diabetes Mother   . Hypertension Other   . Cancer Other   . Pancreatitis Other   . Anesthesia problems Neg Hx     Social History   Tobacco Use  . Smoking status: Never Smoker  . Smokeless tobacco: Never Used  Substance Use Topics  . Alcohol use: Yes    Comment: Daily   . Drug use: Not Currently    Types: Marijuana    Comment: Lasted: 2-3  weeks ago     Home Medications Prior to Admission medications   Medication Sig Start Date End Date Taking? Authorizing Provider  ALPRAZolam Duanne Moron) 1 MG tablet Take 1 mg by mouth 4 (four) times daily as needed for anxiety.  11/10/18  Yes [provider]  Aspirin-Salicylamide-Caffeine (BC HEADACHE POWDER PO) Take 1 packet by mouth daily as needed (pain).   Yes [provider]  chlordiazePOXIDE (LIBRIUM) 25 MG capsule 50mg  PO TID x 1D, then 25-50mg  PO BID X 1D, then 25-50mg  PO QD X 1D 10/13/19   Ayahna Solazzo, PA-C  methocarbamol (ROBAXIN) 500 MG tablet Take 1 tablet (500 mg total) by mouth 2 (two) times daily. Patient not taking: Reported on 10/13/2019 09/02/19   Suzy Bouchard, PA-C  naproxen (NAPROSYN) 500 MG tablet Take 1 tablet (500 mg total) by mouth 2 (two) times daily. Patient not taking: Reported on 10/13/2019 09/02/19   Suzy Bouchard, PA-C    Allergies    Patient has no known allergies.  Review of Systems   Review  of Systems  Constitutional: Positive for diaphoresis. Negative for chills and fever.  Eyes: Negative for visual disturbance.  Respiratory: Negative for shortness of breath.   Cardiovascular: Negative for chest pain.  Gastrointestinal: Positive for nausea. Negative for abdominal pain.  Neurological: Positive for tremors.  All other systems reviewed and are negative.   Physical Exam Updated Vital Signs BP (!) 144/98   Pulse 77   Temp 98.6 F (37 C) (Oral)   Resp 18   LMP 10/04/2019   SpO2 99%   Physical Exam Vitals and nursing note reviewed.  Constitutional:      Appearance: She is not ill-appearing or diaphoretic.  HENT:     Head: Normocephalic and atraumatic.  Eyes:     Extraocular Movements: Extraocular movements intact.     Conjunctiva/sclera: Conjunctivae normal.     Pupils: Pupils are equal, round, and reactive to light.  Cardiovascular:     Rate and Rhythm: Normal rate and regular rhythm.     Pulses: Normal pulses.    Pulmonary:     Effort: Pulmonary effort is normal.     Breath sounds: Normal breath sounds. No wheezing, rhonchi or rales.  Abdominal:     Palpations: Abdomen is soft.     Tenderness: There is no abdominal tenderness. There is no guarding or rebound.  Musculoskeletal:     Cervical back: Neck supple.  Skin:    General: Skin is warm and dry.  Neurological:     Mental Status: She is alert and oriented to person, place, and time. Mental status is at baseline.     Comments: Tremulous     ED Results / Procedures / Treatments   Labs (all labs ordered are listed, but only abnormal results are displayed) Labs Reviewed  COMPREHENSIVE METABOLIC PANEL - Abnormal; Notable for the following components:      Result Value   Total Protein 8.5 (*)    AST 59 (*)    ALT 52 (*)    All other components within normal limits  CBC WITH DIFFERENTIAL/PLATELET - Abnormal; Notable for the following components:   WBC 3.1 (*)    Neutro Abs 1.6 (*)    All other components within normal limits  URINALYSIS, ROUTINE W REFLEX MICROSCOPIC - Abnormal; Notable for the following components:   Color, Urine STRAW (*)    pH 9.0 (*)    Hgb urine dipstick SMALL (*)    Ketones, ur 5 (*)    All other components within normal limits  LIPASE, BLOOD  ETHANOL  I-STAT BETA HCG BLOOD, ED ([facility], [facility], [facility] ONLY)    EKG None  Radiology No results found.  Procedures Procedures (including critical care time)  Medications Ordered in ED Medications  LORazepam (ATIVAN) injection 0-4 mg ( Intravenous See Alternative 10/13/19 1740)    Or  LORazepam (ATIVAN) tablet 0-4 mg (1 mg Oral Given 10/13/19 1740)  LORazepam (ATIVAN) injection 0-4 mg (has no administration in time range)    Or  LORazepam (ATIVAN) tablet 0-4 mg (has no administration in time range)  thiamine tablet 100 mg (100 mg Oral Given 10/13/19 1739)    Or  thiamine (B-1) injection 100 mg ( Intravenous See Alternative 10/13/19 1739)  sodium chloride 0.9 % bolus 1,000 mL  (1,000 mLs Intravenous Bolus from Bag 10/13/19 1823)  ondansetron (ZOFRAN) injection 4 mg (4 mg Intravenous Given 10/13/19 1823)    ED Course  I have reviewed the triage vital signs and the nursing notes.  Pertinent labs & imaging  results that were available during my care of the patient were reviewed by me and considered in my medical decision making (see chart for details).  Clinical Course as of Oct 12 1948  Wed Oct 13, 2019  1747 CIWA-Ar Total: 8   [MV]    Clinical Course User Index [MV] Tanda Rockers, [place]   MDM Rules/Calculators/A&P                      39 year old female who presents to the ED today complaining of possible alcohol withdrawal.  Last drink around 8 PM last night.  She states she typically drinks about 1 bottle of wine per night.  On arrival to the ED patient is afebrile, nontachycardic and nontachypneic.  She does appear tremulous on exam.  She is denying any palpitations, chest pain, shortness of breath.  Does however report not vomiting.  Provide Zofran and fluids at this time.  Will work-up for alcohol withdrawal.  Will obtain EKG and CIWA score prior to Ativan.  Without any SI, HI, AVH.   Work reassuring today.  CBC without leukocytosis.  Hemoglobin stable. CMP with mild elevation in AST and ALT of 59 of 52, suspect this is related to patient's drinking.  She has no abdominal tenderness to palpation.  Lipase 22 UA without infection.  After 1 mg Ativan p.o., fluids, thiamine, Zofran.  Reports she is feeling improved overall.  CIWA score 2.  Feel patient is stable for discharge at this time.  Her significant other is in the room however and expresses great concern for her.  He states that she did take Librium in the past which was helpful.  Patient states that she is willing to try this again.  Will provide at time of discharge.  Will place peer support consult as well. Pt advised to follow up with her PCP. She is in agreement with plan and stable for discharge home.     This note was prepared using Dragon voice recognition software and may include unintentional dictation errors due to the inherent limitations of voice recognition software.  Final Clinical Impression(s) / ED Diagnoses Final diagnoses:  Alcohol withdrawal syndrome without complication (HCC)    Rx / DC Orders ED Discharge Orders         Ordered    chlordiazePOXIDE (LIBRIUM) 25 MG capsule     10/13/19 1948           Discharge Instructions     Please follow up with your PCP regarding your ED visit today I have placed a consult for peer support. They should contact you tomorrow to discuss options. Attached is an additional resource guide regarding counseling for substance abuse   Return to the ED for any worsening symptoms including worsening tremors, excessive vomiting, hallucinations, heart palpations, chest pain, shortness of breath.       Tanda Rockers, PA-C 10/13/19 1950    Virgina Norfolk, DO 10/13/19 2006

## 2019-10-13 NOTE — Discharge Instructions (Signed)
Please follow up with your PCP regarding your ED visit today I have placed a consult for peer support. They should contact you tomorrow to discuss options. Attached is an additional resource guide regarding counseling for substance abuse   Return to the ED for any worsening symptoms including worsening tremors, excessive vomiting, hallucinations, heart palpations, chest pain, shortness of breath.

## 2020-02-22 ENCOUNTER — Ambulatory Visit: Payer: 59 | Admitting: Obstetrics and Gynecology

## 2020-03-21 ENCOUNTER — Encounter (HOSPITAL_COMMUNITY): Payer: Self-pay

## 2020-03-21 ENCOUNTER — Emergency Department (HOSPITAL_COMMUNITY)
Admission: EM | Admit: 2020-03-21 | Discharge: 2020-03-21 | Disposition: A | Discharge status: Home / Self Care | Payer: Medicaid Other | Attending: Emergency Medicine | Admitting: Emergency Medicine

## 2020-03-21 DIAGNOSIS — F329 Major depressive disorder, single episode, unspecified: Secondary | ICD-10-CM | POA: Insufficient documentation

## 2020-03-21 DIAGNOSIS — Z79899 Other long term (current) drug therapy: Secondary | ICD-10-CM | POA: Insufficient documentation

## 2020-03-21 DIAGNOSIS — F10239 Alcohol dependence with withdrawal, unspecified: Secondary | ICD-10-CM | POA: Insufficient documentation

## 2020-03-21 DIAGNOSIS — R112 Nausea with vomiting, unspecified: Secondary | ICD-10-CM | POA: Diagnosis present

## 2020-03-21 DIAGNOSIS — Z7982 Long term (current) use of aspirin: Secondary | ICD-10-CM | POA: Insufficient documentation

## 2020-03-21 DIAGNOSIS — I10 Essential (primary) hypertension: Secondary | ICD-10-CM | POA: Diagnosis not present

## 2020-03-21 DIAGNOSIS — F10930 Alcohol use, unspecified with withdrawal, uncomplicated: Secondary | ICD-10-CM

## 2020-03-21 DIAGNOSIS — F10230 Alcohol dependence with withdrawal, uncomplicated: Secondary | ICD-10-CM

## 2020-03-21 MED ORDER — LORAZEPAM 1 MG PO TABS: 1.0000 mg | ORAL_TABLET | Freq: Once | ORAL | Status: DC

## 2020-03-21 MED ORDER — HYDROXYZINE HCL 25 MG PO TABS
50.0000 mg | ORAL_TABLET | Freq: Once | ORAL | Status: AC
  Administered 2020-03-21: 50 mg via ORAL
  Filled 2020-03-21: qty 2

## 2020-03-21 MED ORDER — CHLORDIAZEPOXIDE HCL 25 MG PO CAPS
ORAL_CAPSULE | ORAL | 0 refills | Status: DC
Start: 2020-03-21 — End: 2021-01-21

## 2020-03-21 MED ORDER — ONDANSETRON 4 MG PO TBDP
4.0000 mg | ORAL_TABLET | Freq: Three times a day (TID) | ORAL | 0 refills | Status: DC | PRN
Start: 2020-03-21 — End: 2021-01-21

## 2020-03-21 NOTE — Discharge Instructions (Addendum)
You were seen in the ED for alcohol withdrawal symptoms.    Unfortunately, we are not able to do inpatient detox here.   You had no signs of severe withdrawal   I have given you a prescription for librium which is a medicine that can be used to ease off and decrease withdrawal symptoms when you stop drinking. Take this as prescribed. DO NOT DRINK ALCOHOL WHILE YOU ARE ON LIBRIUM AS IT WILL CAUSE SEVERE ALCOHOL WITHDRAWAL SYMPTOMS/COMPLICATIONS.   See resources attached, call and go to a facility that has inpatient and/or outpatient detox services   Return for vision changes, bright red blood or black vomit or stool, chest pain, shortness of breath, suicidal or homicidal thoughts, auditory or visual or tactile hallucinations, seizures  [facility] [address] [place], [place] [address] [phone]  [facility]  [address] [place] [address] [phone]   [facility]   [address] [place], [place] [address]  [phone]  [facility]  [address] [place], [place] [address]  [phone]

## 2020-03-21 NOTE — ED Notes (Signed)
Pt tolerated PO fluids as ordered.  °

## 2020-03-21 NOTE — ED Triage Notes (Signed)
Pt presents requesting detox for alcohol. Pt reports she drinks every day and her last drink was last night.

## 2020-03-21 NOTE — ED Provider Notes (Signed)
[facility]-EMERGENCY DEPT Provider Note   CSN: [id number] Arrival date & time: 03/21/20  1324     History Chief Complaint  Patient presents with  . Detox    Diana Espinoza is a 39 y.o. female with history of alcohol use disorder, depression, gastritis, hypertension presents to the ER for alcohol withdrawal symptoms.  Patient states she usually drinks 1 bottle of wine a day.  The last time she had alcohol was a little over 24 hours ago.  This morning she woke up and had "pressure" in her head, hot sweats, nausea and 2 episodes of vomiting.  States she feels dehydrated.  Denies any chest pain, shortness of breath, abdominal pain, hematemesis, diarrhea.  Denies previous history of severe withdrawal symptoms including seizures, hallucinations, hospitalizations.  Was in a detox program in [place] discharge July 30 and states she relapsed 3 weeks later.  She is motivated to stop drinking.  Denies other recreational drug use.  Denies current hallucinations, suicidal ideation, homicidal ideation or thoughts of self-harm.  Of note, patient also brought in her 86 year old daughter to be evaluated in the ER for sore throat.  No interventions.  HPI     Past Medical History:  Diagnosis Date  . Abnormal Pap smear   . Anemia   . Anxiety    on meds sometimes. Coping effectively  . Depression   . Pancreatitis     Patient Active Problem List   Diagnosis Date Noted  . Depression 12/07/2018  . Pancreatitis     Past Surgical History:  Procedure Laterality Date  . CHOLECYSTECTOMY    . ECTOPIC PREGNANCY SURGERY  2000  . TONSILLECTOMY       OB History    Gravida  5   Para      Term      Preterm      AB  2   Living  3     SAB      TAB  1   Ectopic  1   Multiple      Live Births              Family History  Problem Relation Age of Onset  . Diabetes Mother   . Hypertension Other   . Cancer Other   . Pancreatitis Other   . Anesthesia  problems Neg Hx     Social History   Tobacco Use  . Smoking status: Never Smoker  . Smokeless tobacco: Never Used  Vaping Use  . Vaping Use: Never used  Substance Use Topics  . Alcohol use: Yes    Comment: Daily   . Drug use: Not Currently    Types: Marijuana    Comment: Lasted: 2-3 weeks ago     Home Medications Prior to Admission medications   Medication Sig Start Date End Date Taking? Authorizing Provider  ALPRAZolam Prudy Feeler) 1 MG tablet Take 1 mg by mouth 4 (four) times daily as needed for anxiety.  11/10/18   [provider]  Aspirin-Salicylamide-Caffeine (BC HEADACHE POWDER PO) Take 1 packet by mouth daily as needed (pain).    [provider]  chlordiazePOXIDE (LIBRIUM) 25 MG capsule 50mg  PO TID x 1D, then 25-50mg  PO BID X 1D, then 25-50mg  PO QD X 1D 03/21/20   05/21/20, PA-C  methocarbamol (ROBAXIN) 500 MG tablet Take 1 tablet (500 mg total) by mouth 2 (two) times daily. Patient not taking: Reported on 10/13/2019 09/02/19   09/04/19, PA-C  naproxen (  NAPROSYN) 500 MG tablet Take 1 tablet (500 mg total) by mouth 2 (two) times daily. Patient not taking: Reported on 10/13/2019 09/02/19   Claudette Stapler C, PA-C  ondansetron (ZOFRAN ODT) 4 MG disintegrating tablet Take 1 tablet (4 mg total) by mouth every 8 (eight) hours as needed for nausea or vomiting. 03/21/20   Liberty Handy, PA-C    Allergies    Patient has no known allergies.  Review of Systems   Review of Systems  Gastrointestinal: Positive for nausea and vomiting (Resolved).  Endocrine: Positive for heat intolerance ("Hot flashes".).  Neurological: Positive for headaches.  All other systems reviewed and are negative.   Physical Exam Updated Vital Signs BP 131/82   Pulse 70   Temp 98.4 F (36.9 C) (Oral)   Resp 18   LMP 03/11/2020 (Approximate)   SpO2 99%   Physical Exam Vitals and nursing note reviewed.  Constitutional:      Appearance: She is well-developed.      Comments: Non toxic in NAD  HENT:     Head: Normocephalic and atraumatic.     Nose: Nose normal.     Mouth/Throat:     Comments: Moist mucous membranes Eyes:     Conjunctiva/sclera: Conjunctivae normal.  Cardiovascular:     Rate and Rhythm: Normal rate and regular rhythm.     Comments: No tachycardia. Blood pressure 134/92 Pulmonary:     Effort: Pulmonary effort is normal.     Breath sounds: Normal breath sounds.  Abdominal:     General: Bowel sounds are normal.     Palpations: Abdomen is soft.     Tenderness: There is no abdominal tenderness.     Comments: Soft, nontender.  No upper/epigastric or RUQ abdominal tenderness.  Musculoskeletal:        General: Normal range of motion.     Cervical back: Normal range of motion.  Skin:    General: Skin is warm and dry.     Capillary Refill: Capillary refill takes less than 2 seconds.  Neurological:     Mental Status: She is alert.     Comments: Speech normal. Spontaneous equal/symmetric movement of all extremities. No tremors. Gait steady.   Psychiatric:        Behavior: Behavior normal.     Comments: Denies SI, HI, AVH. Slightly flat affect. Appropriate eye contact. Mood appears stable. Appropriate historian, cooperative.      ED Results / Procedures / Treatments   Labs (all labs ordered are listed, but only abnormal results are displayed) Labs Reviewed - No data to display  EKG None  Radiology No results found.  Procedures Procedures (including critical care time)  Medications Ordered in ED Medications  hydrOXYzine (ATARAX/VISTARIL) tablet 50 mg (50 mg Oral Given 03/21/20 1553)    ED Course  I have reviewed the triage vital signs and the nursing notes.  Pertinent labs & imaging results that were available during my care of the patient were reviewed by me and considered in my medical decision making (see chart for details).  Clinical Course as of Mar 21 1704  Tue Mar 21, 2020  1523 [place] left  July 30th and relapsed 3 weeks ago    [CG]    Clinical Course User Index [CG] Jerrell Mylar   MDM Rules/Calculators/A&P                           Patient's EMR, triage nursing notes  reviewed to obtain more history and assist with MDM.  She has been seen in the ED before for mild alcohol withdrawal symptoms, gastritis.  Has never required hospitalization for severe withdrawal symptoms.  Denies history of withdrawal seizures, hallucinations.  No symptoms or signs to suggest severe withdrawal or DT here.  Vital signs normal without tachycardia, hypertension.  No tremors noted.  Resolved nausea and vomiting prior to arrival.  Patient tolerated fluid challenge here in the ED.  Abdomen is soft and nontender.  She denies SI, HI or AVH.  No indication for emergent lab work, intervention or admission.  This was explained to patient who is in agreement.  No signs or symptoms to suggest acute pancreatitis, gallbladder process, severe dehydration, severe alcohol withdrawal.   Unfortunately, patient relapsed just 3 weeks ago after several months of abstinence.  She is motivated to stop drinking.  Unable to give Ativan here as patient also brought her 46 year old daughter to be evaluated in the ED for sore throat and she is driving.  She was given Vistaril and tolerated well.  Patient was reevaluated and no clinical decline, vital signs remained normal.  We will discharge with Librium taper, Zofran and outpatient resources.  Return precautions discussed.  Patient is agreement with plan of care.  Final Clinical Impression(s) / ED Diagnoses Final diagnoses:  Alcohol withdrawal syndrome without complication (HCC)    Rx / DC Orders ED Discharge Orders         Ordered    chlordiazePOXIDE (LIBRIUM) 25 MG capsule        03/21/20 1555    ondansetron (ZOFRAN ODT) 4 MG disintegrating tablet  Every 8 hours PRN        03/21/20 1555           Jerrell Mylar 03/21/20 1705      Gerhard Munch, MD 03/21/20 2340

## 2020-03-21 NOTE — ED Notes (Signed)
An After Visit Summary was printed and given to the patient. Discharge instructions given and no further questions at this time.  

## 2021-01-21 ENCOUNTER — Inpatient Hospital Stay (HOSPITAL_COMMUNITY)
Admission: AD | Admit: 2021-01-21 | Discharge: 2021-01-21 | Disposition: A | Discharge status: Home / Self Care | Payer: Medicaid Other | Attending: Family Medicine | Admitting: Family Medicine

## 2021-01-21 ENCOUNTER — Encounter (HOSPITAL_COMMUNITY): Payer: Self-pay | Admitting: Family Medicine

## 2021-01-21 ENCOUNTER — Inpatient Hospital Stay (HOSPITAL_COMMUNITY): Payer: Medicaid Other

## 2021-01-21 ENCOUNTER — Other Ambulatory Visit: Payer: Self-pay

## 2021-01-21 DIAGNOSIS — Z79899 Other long term (current) drug therapy: Secondary | ICD-10-CM | POA: Insufficient documentation

## 2021-01-21 DIAGNOSIS — F419 Anxiety disorder, unspecified: Secondary | ICD-10-CM | POA: Diagnosis not present

## 2021-01-21 DIAGNOSIS — Z3491 Encounter for supervision of normal pregnancy, unspecified, first trimester: Secondary | ICD-10-CM

## 2021-01-21 DIAGNOSIS — R109 Unspecified abdominal pain: Secondary | ICD-10-CM

## 2021-01-21 DIAGNOSIS — O26891 Other specified pregnancy related conditions, first trimester: Secondary | ICD-10-CM

## 2021-01-21 DIAGNOSIS — O99341 Other mental disorders complicating pregnancy, first trimester: Secondary | ICD-10-CM | POA: Diagnosis not present

## 2021-01-21 DIAGNOSIS — F32A Depression, unspecified: Secondary | ICD-10-CM | POA: Insufficient documentation

## 2021-01-21 DIAGNOSIS — Z3A01 Less than 8 weeks gestation of pregnancy: Secondary | ICD-10-CM | POA: Diagnosis not present

## 2021-01-21 LAB — CBC
HCT: 37.2 % (ref 36.0–46.0)
Hemoglobin: 12.2 g/dL (ref 12.0–15.0)
MCH: 31.8 pg (ref 26.0–34.0)
MCHC: 32.8 g/dL (ref 30.0–36.0)
MCV: 96.9 fL (ref 80.0–100.0)
Platelets: 325 10*3/uL (ref 150–400)
RBC: 3.84 MIL/uL — ABNORMAL LOW (ref 3.87–5.11)
RDW: 13.6 % (ref 11.5–15.5)
WBC: 5.3 10*3/uL (ref 4.0–10.5)
nRBC: 0 % (ref 0.0–0.2)

## 2021-01-21 LAB — URINALYSIS, ROUTINE W REFLEX MICROSCOPIC
Bilirubin Urine: NEGATIVE
Glucose, UA: NEGATIVE mg/dL
Hgb urine dipstick: NEGATIVE
Ketones, ur: NEGATIVE mg/dL
Leukocytes,Ua: NEGATIVE
Nitrite: NEGATIVE
Protein, ur: NEGATIVE mg/dL
Specific Gravity, Urine: 1.019 (ref 1.005–1.030)
pH: 5 (ref 5.0–8.0)

## 2021-01-21 LAB — POCT PREGNANCY, URINE: Preg Test, Ur: POSITIVE — AB

## 2021-01-21 LAB — HCG, QUANTITATIVE, PREGNANCY: hCG, Beta Chain, Quant, S: 11223 m[IU]/mL — ABNORMAL HIGH (ref <5)

## 2021-01-21 NOTE — MAU Note (Signed)
Presents with c/o abdominal pain that began 2 days ago.  States has Hx of ectopic pregnancy in the past and it feel the same.  +HPT.  Denies VB.  LMP 12/15/2020.

## 2021-01-21 NOTE — MAU Provider Note (Signed)
History     CSN: [id number]  Arrival date and time: 01/21/21 5284   Event Date/Time   First Provider Initiated Contact with Patient 01/21/21 [phone]      Chief Complaint  Patient presents with   Abdominal Pain   HPI Diana Espinoza is a 40 y.o. [id number] at [redacted]w[redacted]d who presents with abdominal pain. Reports sharp pains primarily in her right lower quadrant that started 2 days ago. Pain has gradually gotten worse. Mainly occurs with sneezing, position changes, and walking. Rates pain 6/10. Hasn't treated symptoms. Endorses n/v & diarrhea. Denies fever, dysuria, vaginal bleeding, or vaginal discharge.  Hx of ruptured ectopic pregnancy in 2011. States it was right sided. Patient is sure that they did not remove her fallopian tube.   OB History     Gravida  6   Para  3   Term  3   Preterm      AB  2   Living  3      SAB      IAB  1   Ectopic  1   Multiple      Live Births  3           Past Medical History:  Diagnosis Date   Abnormal Pap smear    Anemia    Anxiety    on meds sometimes. Coping effectively   Depression    Pancreatitis     Past Surgical History:  Procedure Laterality Date   CHOLECYSTECTOMY     ECTOPIC PREGNANCY SURGERY  2000   TONSILLECTOMY      Family History  Problem Relation Age of Onset   Diabetes Mother    Cancer Sister    Cancer Maternal Grandmother    Hypertension Other    Cancer Other    Pancreatitis Other    Anesthesia problems Neg Hx     Social History   Tobacco Use   Smoking status: Never   Smokeless tobacco: Never  Vaping Use   Vaping Use: Never used  Substance Use Topics   Alcohol use: Not Currently   Drug use: Not Currently    Types: Marijuana    Allergies: No Known Allergies  Medications Prior to Admission  Medication Sig Dispense Refill Last Dose   Multiple Vitamin (MULTIVITAMIN) tablet Take 2 tablets by mouth daily.   01/21/2021   ALPRAZolam (XANAX) 1 MG tablet Take 1 mg by mouth 4 (four) times daily as  needed for anxiety.       Aspirin-Salicylamide-Caffeine (BC HEADACHE POWDER PO) Take 1 packet by mouth daily as needed (pain).      chlordiazePOXIDE (LIBRIUM) 25 MG capsule 50mg  PO TID x 1D, then 25-50mg  PO BID X 1D, then 25-50mg  PO QD X 1D 10 capsule 0    methocarbamol (ROBAXIN) 500 MG tablet Take 1 tablet (500 mg total) by mouth 2 (two) times daily. (Patient not taking: Reported on 10/13/2019) 20 tablet 0    naproxen (NAPROSYN) 500 MG tablet Take 1 tablet (500 mg total) by mouth 2 (two) times daily. (Patient not taking: Reported on 10/13/2019) 30 tablet 0    ondansetron (ZOFRAN ODT) 4 MG disintegrating tablet Take 1 tablet (4 mg total) by mouth every 8 (eight) hours as needed for nausea or vomiting. 20 tablet 0     Review of Systems  Constitutional: Negative.   Gastrointestinal:  Positive for abdominal pain, diarrhea, nausea and vomiting.  Genitourinary: Negative.   Physical Exam   Blood pressure 121/75, pulse 78, temperature 98.2  F (36.8 C), temperature source Oral, resp. rate 20, height 5\' 4"  (1.626 m), weight 86.1 kg, last menstrual period 12/15/2020, SpO2 98 %.  Physical Exam Vitals and nursing note reviewed.  Constitutional:      General: She is not in acute distress.    Appearance: She is well-developed. She is not ill-appearing.  Eyes:     General: No scleral icterus. Pulmonary:     Effort: Pulmonary effort is normal. No respiratory distress.  Abdominal:     General: Abdomen is flat.     Palpations: Abdomen is soft.     Tenderness: There is no abdominal tenderness.  Skin:    General: Skin is warm and dry.  Neurological:     Mental Status: She is alert.  Psychiatric:        Mood and Affect: Mood normal.        Behavior: Behavior normal.    MAU Course  Procedures Results for orders placed or performed during the hospital encounter of 01/21/21 (from the past 24 hour(s))  Pregnancy, urine POC     Status: Abnormal   Collection Time: 01/21/21  9:26 AM  Result Value Ref  Range   Preg Test, Ur POSITIVE (A) NEGATIVE  Urinalysis, Routine w reflex microscopic Urine, Clean Catch     Status: Abnormal   Collection Time: 01/21/21  9:35 AM  Result Value Ref Range   Color, Urine YELLOW YELLOW   APPearance CLOUDY (A) CLEAR   Specific Gravity, Urine 1.019 1.005 - 1.030   pH 5.0 5.0 - 8.0   Glucose, UA NEGATIVE NEGATIVE mg/dL   Hgb urine dipstick NEGATIVE NEGATIVE   Bilirubin Urine NEGATIVE NEGATIVE   Ketones, ur NEGATIVE NEGATIVE mg/dL   Protein, ur NEGATIVE NEGATIVE mg/dL   Nitrite NEGATIVE NEGATIVE   Leukocytes,Ua NEGATIVE NEGATIVE  CBC     Status: Abnormal   Collection Time: 01/21/21 10:12 AM  Result Value Ref Range   WBC 5.3 4.0 - 10.5 K/uL   RBC 3.84 (L) 3.87 - 5.11 MIL/uL   Hemoglobin 12.2 12.0 - 15.0 g/dL   HCT 01/23/21 95.6 - 38.7 %   MCV 96.9 80.0 - 100.0 fL   MCH 31.8 26.0 - 34.0 pg   MCHC 32.8 30.0 - 36.0 g/dL   RDW 56.4 33.2 - 95.1 %   Platelets 325 150 - 400 K/uL   nRBC 0.0 0.0 - 0.2 %  hCG, quantitative, pregnancy     Status: Abnormal   Collection Time: 01/21/21 10:12 AM  Result Value Ref Range   hCG, Beta Chain, Quant, S 11,223 (H) <5 mIU/mL   01/23/21 OB LESS THAN 14 WEEKS WITH OB TRANSVAGINAL  Addendum Date: 01/21/2021   ADDENDUM REPORT: 01/21/2021 11:59 ADDENDUM: The crown-rump length is 1.4 mm, not 7 mm. Given the tiny crown-rump length, it is not surprising cardiac activity is not identified. The lack of cardiac activity is likely due to the early nature of the pregnancy. Recommend attention on follow-up. This study is not suspicious for failed pregnancy but rather most consistent with a very early pregnancy. Electronically Signed   By: 01/23/2021 III M.D.   On: 01/21/2021 11:59   Result Date: 01/21/2021 CLINICAL DATA:  Abdominal pain.  Pregnant patient. EXAM: OBSTETRIC <14 WK 01/23/2021 AND TRANSVAGINAL OB US TECHNIQUE: Both transabdominal and transvaginal ultrasound examinations were performed for complete evaluation of the gestation as well as the  maternal uterus, adnexal regions, and pelvic cul-de-sac. Transvaginal technique was performed to assess early pregnancy. COMPARISON:  None.  FINDINGS: Intrauterine gestational sac: Single Yolk sac:  Visualized. Embryo:  Visualized. Cardiac Activity: Not Visualized. MSD:   mm    w     d CRL:  7.4 mm Subchorionic hemorrhage:  None visualized. Maternal uterus/adnexae: The ovaries are unremarkable. Corpus luteum cyst on the right. IMPRESSION: 1. An IUP is identified. A gestational sac, yolk sac, and fetal pole are noted. No cardiac activity is seen in the tiny fetal pole. Findings are suspicious but not yet definitive for failed pregnancy. Recommend follow-up US in 10-14 days for definitive diagnosis. This recommendation follows SRU consensus guidelines: Diagnostic Criteria for Nonviable Pregnancy Early in the First Trimester. Malva Limes Med 2013; 315:4008-67. 2. No other abnormalities. Electronically Signed: By: Gerome Sam III M.D. On: 01/21/2021 11:45    MDM +UPT UA, CBC, ABO/Rh, quant hCG, and [place] today to rule out ectopic pregnancy which can be life threatening.   Ultrasound shows IUP, CRL 1.4 mm, no cardiac activity likely due to small size. Right corpus luteal cyst. Ectopic pregnancy excluded. Pain likely related to CLC. Patient instructed to start prenatal care.    Assessment and Plan   1. Normal IUP (intrauterine pregnancy) on prenatal ultrasound, first trimester   2. Abdominal pain during pregnancy in first trimester   3. [redacted] weeks gestation of pregnancy    -start prenatal care - given list of providers -reviewed reasons to return to MAU  Judeth Horn 01/21/2021, 12:17 PM

## 2021-01-21 NOTE — Discharge Instructions (Signed)
Return to care  If you have heavier bleeding that soaks through more than 2 pads per hour for an hour or more If you bleed so much that you feel like you might pass out or you do pass out If you have significant abdominal pain that is not improved with Tylenol     [facility] Providers          [facility]  [address], , [place] [address]  [phone]  [facility]  [address], [place], [place] [address]  [phone]  [facility]  [address], [place], [place] [address]  [phone]  [facility] [place]  [address], [place], [place] [address]  [phone]  [facility]  [address] floor), [place], [place] [address]  [phone]  [facility] [address], [place], [place] [address] [phone]  [facility]  [address], [place], [place] [address]  [phone]  [facility]  [address], [place], [place] [address]  [phone]  [facility]  [address], [place], [place] [address]  [phone]  [facility]  [address], [place], [place] [address]  [phone]  [facility]  [address], [place], [place] [address]  [phone]  [facility]  [address], [place], [place] [address]  [phone]  [facility]   [address], [place], [place] [address]  [phone]  [facility]  [address], [place],  [address]   [phone]  [facility]  [address], [place],  [address]  [phone]         

## 2021-02-14 LAB — OB RESULTS CONSOLE GC/CHLAMYDIA
Chlamydia: NEGATIVE
Gonorrhea: NEGATIVE

## 2021-03-09 LAB — OB RESULTS CONSOLE RUBELLA ANTIBODY, IGM: Rubella: IMMUNE

## 2021-03-09 LAB — OB RESULTS CONSOLE ABO/RH: RH Type: POSITIVE

## 2021-03-09 LAB — OB RESULTS CONSOLE HIV ANTIBODY (ROUTINE TESTING): HIV: NONREACTIVE

## 2021-03-09 LAB — HEPATITIS C ANTIBODY: HCV Ab: NEGATIVE

## 2021-03-09 LAB — OB RESULTS CONSOLE HEPATITIS B SURFACE ANTIGEN: Hepatitis B Surface Ag: NEGATIVE

## 2021-03-09 LAB — OB RESULTS CONSOLE RPR: RPR: NONREACTIVE

## 2021-03-09 LAB — OB RESULTS CONSOLE ANTIBODY SCREEN: Antibody Screen: NEGATIVE

## 2021-04-04 ENCOUNTER — Other Ambulatory Visit: Payer: Self-pay

## 2021-04-04 ENCOUNTER — Emergency Department (HOSPITAL_COMMUNITY)
Admission: EM | Admit: 2021-04-04 | Discharge: 2021-04-05 | Disposition: A | Discharge status: Home / Self Care | Payer: 59 | Attending: Emergency Medicine | Admitting: Emergency Medicine

## 2021-04-04 DIAGNOSIS — Z5321 Procedure and treatment not carried out due to patient leaving prior to being seen by health care provider: Secondary | ICD-10-CM | POA: Insufficient documentation

## 2021-04-04 DIAGNOSIS — M25519 Pain in unspecified shoulder: Secondary | ICD-10-CM | POA: Diagnosis not present

## 2021-04-04 DIAGNOSIS — R079 Chest pain, unspecified: Secondary | ICD-10-CM | POA: Insufficient documentation

## 2021-04-04 DIAGNOSIS — O26891 Other specified pregnancy related conditions, first trimester: Secondary | ICD-10-CM | POA: Insufficient documentation

## 2021-04-04 DIAGNOSIS — Z3A01 Less than 8 weeks gestation of pregnancy: Secondary | ICD-10-CM | POA: Insufficient documentation
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
## 2021-04-04 NOTE — ED Triage Notes (Addendum)
PT BIB [facility] from home c/o CP and shoulder pain approx 30 min ago awaken from sleep. Pain worsens with movement. Pt given 324 ASA  PTA. Per note pt is current 4 month pregnant. No OBGYN problems.

## 2021-04-05 ENCOUNTER — Emergency Department (HOSPITAL_COMMUNITY): Payer: 59

## 2021-04-05 DIAGNOSIS — O26891 Other specified pregnancy related conditions, first trimester: Secondary | ICD-10-CM | POA: Diagnosis not present

## 2021-04-05 NOTE — ED Notes (Signed)
FHT 138

## 2021-04-05 NOTE — ED Notes (Signed)
Called multiple times for vitals. No answer

## 2021-07-17 ENCOUNTER — Inpatient Hospital Stay (HOSPITAL_COMMUNITY)
Admission: AD | Admit: 2021-07-17 | Discharge: 2021-07-17 | Disposition: A | Discharge status: Home / Self Care | Payer: Medicaid Other | Attending: Obstetrics and Gynecology | Admitting: Obstetrics and Gynecology

## 2021-07-17 ENCOUNTER — Encounter (HOSPITAL_COMMUNITY): Payer: Self-pay | Admitting: Obstetrics and Gynecology

## 2021-07-17 DIAGNOSIS — Z3689 Encounter for other specified antenatal screening: Secondary | ICD-10-CM

## 2021-07-17 DIAGNOSIS — O368130 Decreased fetal movements, third trimester, not applicable or unspecified: Secondary | ICD-10-CM | POA: Insufficient documentation

## 2021-07-17 DIAGNOSIS — Z3A30 30 weeks gestation of pregnancy: Secondary | ICD-10-CM | POA: Insufficient documentation

## 2021-07-17 NOTE — MAU Provider Note (Signed)
History     CSN: [id number]  Arrival date and time: 07/17/21 1211   Event Date/Time   First Provider Initiated Contact with Patient 07/17/21 1302      Chief Complaint  Patient presents with   Decreased Fetal Movement   HPI Diana Espinoza is a 41 y.o. [id number] at [redacted]w[redacted]d who presents from the office for evaluation of decreased fetal movement. Reports decreased movement for the last 3 days. Normally notices decrease when she's up & moving around. States she notices more movement when she's lying down. Denies abdominal pain, vaginal bleeding, or LOF.   OB History     Gravida  6   Para  3   Term  3   Preterm      AB  2   Living  3      SAB      IAB  1   Ectopic  1   Multiple      Live Births  3           Past Medical History:  Diagnosis Date   Abnormal Pap smear    Anemia    Anxiety    on meds sometimes. Coping effectively   Depression    Pancreatitis     Past Surgical History:  Procedure Laterality Date   CHOLECYSTECTOMY     ECTOPIC PREGNANCY SURGERY  2000   TONSILLECTOMY      Family History  Problem Relation Age of Onset   Diabetes Mother    Cancer Sister    Cancer Maternal Grandmother    Hypertension Other    Cancer Other    Pancreatitis Other    Anesthesia problems Neg Hx     Social History   Tobacco Use   Smoking status: Never   Smokeless tobacco: Never  Vaping Use   Vaping Use: Never used  Substance Use Topics   Alcohol use: Not Currently   Drug use: Not Currently    Types: Marijuana    Allergies: No Known Allergies  Medications Prior to Admission  Medication Sig Dispense Refill Last Dose   Multiple Vitamin (MULTIVITAMIN) tablet Take 2 tablets by mouth daily.   07/17/2021   famotidine (PEPCID) 20 MG tablet Take 20 mg by mouth 2 (two) times daily.      loratadine (CLARITIN) 10 MG tablet Take 10 mg by mouth daily.      ZOLOFT 25 MG tablet Take by mouth.       Review of Systems  Constitutional: Negative.    Gastrointestinal: Negative.   Genitourinary: Negative.   Physical Exam   Blood pressure (!) 102/58, pulse 88, temperature 97.9 F (36.6 C), resp. rate 16, height 5\' 3"  (1.6 m), weight 92.1 kg, last menstrual period 12/15/2020.  Physical Exam Vitals and nursing note reviewed.  Constitutional:      General: She is not in acute distress.    Appearance: Normal appearance.  HENT:     Head: Normocephalic and atraumatic.  Eyes:     General: No scleral icterus.    Conjunctiva/sclera: Conjunctivae normal.  Pulmonary:     Effort: Pulmonary effort is normal. No respiratory distress.  Neurological:     General: No focal deficit present.     Mental Status: She is alert.  Psychiatric:        Mood and Affect: Mood normal.        Behavior: Behavior normal.   NST:  Baseline: 135 bpm, Variability: Good {> 6 bpm), Accelerations: 10x10, and Decelerations: Absent  MAU Course  Procedures  MDM Patient reports good movement in MAU & tracked 34 movements while on the monitor. Has a reactive fetal tracing. Reports no other complaints, states she is reassured & ready to be discharged home.   Assessment and Plan   1. Decreased fetal movements in third trimester, single or unspecified fetus   2. NST (non-stress test) reactive   3. [redacted] weeks gestation of pregnancy    -reviewed kick counts & reasons to return to MAU -keep f/u appointment with [facility] ob   Judeth Horn 07/17/2021, 1:02 PM

## 2021-07-17 NOTE — MAU Note (Addendum)
Patient arrived to MAU from [facility] appointment stating that she has had DFM x 3 days. Patient stated that she feels the baby move 1-2 times in an hour.  Denies vaginal bleeding, and or leakage of fluid.' She reports of having clogged right ear for a month.Doctor aware.  Pain 0/10

## 2021-08-28 LAB — OB RESULTS CONSOLE GBS: GBS: NEGATIVE

## 2021-09-04 ENCOUNTER — Telehealth (HOSPITAL_COMMUNITY): Payer: Self-pay | Admitting: *Deleted

## 2021-09-04 NOTE — Telephone Encounter (Signed)
Preadmission screen  

## 2021-09-05 ENCOUNTER — Encounter (HOSPITAL_COMMUNITY): Payer: Self-pay | Admitting: *Deleted

## 2021-09-09 ENCOUNTER — Other Ambulatory Visit: Payer: Self-pay | Admitting: Obstetrics and Gynecology

## 2021-09-14 ENCOUNTER — Inpatient Hospital Stay (HOSPITAL_COMMUNITY): Payer: Medicaid Other | Admitting: Anesthesiology

## 2021-09-14 ENCOUNTER — Inpatient Hospital Stay (HOSPITAL_COMMUNITY)
Admission: AD | Admit: 2021-09-14 | Discharge: 2021-09-17 | DRG: 788 | Disposition: A | Discharge status: Home / Self Care | Payer: Medicaid Other | Attending: Obstetrics and Gynecology | Admitting: Obstetrics and Gynecology

## 2021-09-14 ENCOUNTER — Inpatient Hospital Stay (HOSPITAL_COMMUNITY): Payer: Medicaid Other

## 2021-09-14 ENCOUNTER — Other Ambulatory Visit: Payer: Self-pay

## 2021-09-14 ENCOUNTER — Encounter (HOSPITAL_COMMUNITY): Payer: Self-pay | Admitting: Obstetrics and Gynecology

## 2021-09-14 DIAGNOSIS — O26893 Other specified pregnancy related conditions, third trimester: Secondary | ICD-10-CM | POA: Diagnosis present

## 2021-09-14 DIAGNOSIS — Z3A39 39 weeks gestation of pregnancy: Secondary | ICD-10-CM

## 2021-09-14 DIAGNOSIS — O9902 Anemia complicating childbirth: Secondary | ICD-10-CM | POA: Diagnosis present

## 2021-09-14 DIAGNOSIS — O09523 Supervision of elderly multigravida, third trimester: Principal | ICD-10-CM | POA: Diagnosis present

## 2021-09-14 LAB — CBC
HCT: 32.9 % — ABNORMAL LOW (ref 36.0–46.0)
Hemoglobin: 10.7 g/dL — ABNORMAL LOW (ref 12.0–15.0)
MCH: 30.5 pg (ref 26.0–34.0)
MCHC: 32.5 g/dL (ref 30.0–36.0)
MCV: 93.7 fL (ref 80.0–100.0)
Platelets: 337 10*3/uL (ref 150–400)
RBC: 3.51 MIL/uL — ABNORMAL LOW (ref 3.87–5.11)
RDW: 16.8 % — ABNORMAL HIGH (ref 11.5–15.5)
WBC: 8.5 10*3/uL (ref 4.0–10.5)
nRBC: 0 % (ref 0.0–0.2)

## 2021-09-14 LAB — RPR: RPR Ser Ql: NONREACTIVE

## 2021-09-14 MED ORDER — LACTATED RINGERS IV SOLN: 500.0000 mL | Freq: Once | INTRAVENOUS | Status: DC

## 2021-09-14 MED ORDER — OXYTOCIN-SODIUM CHLORIDE 30-0.9 UT/500ML-% IV SOLN
2.5000 [IU]/h | INTRAVENOUS | Status: DC
Start: 2021-09-14 — End: 2021-09-15
  Filled 2021-09-14: qty 500

## 2021-09-14 MED ORDER — EPHEDRINE 5 MG/ML INJ
10.0000 mg | INTRAVENOUS | Status: DC | PRN
  Filled 2021-09-14: qty 5

## 2021-09-14 MED ORDER — FENTANYL CITRATE (PF) 100 MCG/2ML IJ SOLN: 50.0000 ug | INTRAMUSCULAR | Status: DC | PRN

## 2021-09-14 MED ORDER — EPHEDRINE 5 MG/ML INJ: 10.0000 mg | INTRAVENOUS | Status: DC | PRN

## 2021-09-14 MED ORDER — FENTANYL-BUPIVACAINE-NACL 0.5-0.125-0.9 MG/250ML-% EP SOLN
12.0000 mL/h | EPIDURAL | Status: DC | PRN
  Administered 2021-09-14: 12 mL/h via EPIDURAL
  Filled 2021-09-14: qty 250

## 2021-09-14 MED ORDER — LACTATED RINGERS IV SOLN: INTRAVENOUS | Status: DC

## 2021-09-14 MED ORDER — SOD CITRATE-CITRIC ACID 500-334 MG/5ML PO SOLN
30.0000 mL | ORAL | Status: DC | PRN
  Administered 2021-09-15: 30 mL via ORAL
  Filled 2021-09-14: qty 30

## 2021-09-14 MED ORDER — LIDOCAINE HCL (PF) 1 % IJ SOLN: 30.0000 mL | INTRAMUSCULAR | Status: DC | PRN

## 2021-09-14 MED ORDER — LIDOCAINE-EPINEPHRINE (PF) 2 %-1:200000 IJ SOLN
INTRAMUSCULAR | Status: DC | PRN
  Administered 2021-09-14: 5 mL via EPIDURAL
  Administered 2021-09-15: 2 mL via EPIDURAL
  Administered 2021-09-15: 10 mL via EPIDURAL

## 2021-09-14 MED ORDER — TERBUTALINE SULFATE 1 MG/ML IJ SOLN: 0.2500 mg | Freq: Once | INTRAMUSCULAR | Status: DC | PRN

## 2021-09-14 MED ORDER — LACTATED RINGERS IV SOLN
500.0000 mL | INTRAVENOUS | Status: DC | PRN
  Administered 2021-09-15: 500 mL via INTRAVENOUS

## 2021-09-14 MED ORDER — PHENYLEPHRINE 40 MCG/ML (10ML) SYRINGE FOR IV PUSH (FOR BLOOD PRESSURE SUPPORT): 80.0000 ug | PREFILLED_SYRINGE | INTRAVENOUS | Status: DC | PRN

## 2021-09-14 MED ORDER — DIPHENHYDRAMINE HCL 50 MG/ML IJ SOLN
12.5000 mg | INTRAMUSCULAR | Status: DC | PRN
  Administered 2021-09-15: 12.5 mg via INTRAVENOUS

## 2021-09-14 MED ORDER — PHENYLEPHRINE 40 MCG/ML (10ML) SYRINGE FOR IV PUSH (FOR BLOOD PRESSURE SUPPORT)
80.0000 ug | PREFILLED_SYRINGE | INTRAVENOUS | Status: DC | PRN
  Filled 2021-09-14: qty 10

## 2021-09-14 MED ORDER — OXYCODONE-ACETAMINOPHEN 5-325 MG PO TABS: 1.0000 | ORAL_TABLET | ORAL | Status: DC | PRN

## 2021-09-14 MED ORDER — MISOPROSTOL 25 MCG QUARTER TABLET
25.0000 ug | ORAL_TABLET | ORAL | Status: DC | PRN
  Administered 2021-09-14 (×2): 25 ug via VAGINAL
  Filled 2021-09-14 (×2): qty 1

## 2021-09-14 MED ORDER — OXYTOCIN BOLUS FROM INFUSION: 333.0000 mL | Freq: Once | INTRAVENOUS | Status: DC

## 2021-09-14 MED ORDER — OXYTOCIN-SODIUM CHLORIDE 30-0.9 UT/500ML-% IV SOLN
1.0000 m[IU]/min | INTRAVENOUS | Status: DC
  Administered 2021-09-14: 2 m[IU]/min via INTRAVENOUS

## 2021-09-14 MED ORDER — ACETAMINOPHEN 325 MG PO TABS
650.0000 mg | ORAL_TABLET | ORAL | Status: DC | PRN
  Administered 2021-09-14 – 2021-09-15 (×2): 650 mg via ORAL
  Filled 2021-09-14 (×2): qty 2

## 2021-09-14 MED ORDER — LACTATED RINGERS IV SOLN: 500.0000 mL | Freq: Once | INTRAVENOUS | Status: AC

## 2021-09-14 MED ORDER — FENTANYL-BUPIVACAINE-NACL 0.5-0.125-0.9 MG/250ML-% EP SOLN: 12.0000 mL/h | EPIDURAL | Status: DC | PRN

## 2021-09-14 MED ORDER — OXYCODONE-ACETAMINOPHEN 5-325 MG PO TABS: 2.0000 | ORAL_TABLET | ORAL | Status: DC | PRN

## 2021-09-14 MED ORDER — ONDANSETRON HCL 4 MG/2ML IJ SOLN
4.0000 mg | Freq: Four times a day (QID) | INTRAMUSCULAR | Status: DC | PRN
  Administered 2021-09-15 (×2): 4 mg via INTRAVENOUS
  Filled 2021-09-14 (×2): qty 2

## 2021-09-14 NOTE — Anesthesia Preprocedure Evaluation (Signed)
Anesthesia Evaluation  ?Patient identified by MRN, date of birth, ID band ?Patient awake ? ? ? ?Reviewed: ?Allergy & Precautions, Patient's Chart, lab work & pertinent test results ? ?Airway ?Mallampati: II ? ? ? ? ? ? Dental ?no notable dental hx. ? ?  ?Pulmonary ? ?  ?Pulmonary exam normal ? ? ? ? ? ? ? Cardiovascular ?negative cardio ROS ?Normal cardiovascular exam ? ? ?  ?Neuro/Psych ?PSYCHIATRIC DISORDERS Anxiety Depression negative neurological ROS ?   ? GI/Hepatic ?Neg liver ROS,   ?Endo/Other  ? ? Renal/GU ?  ? ?  ?Musculoskeletal ? ? Abdominal ?  ?Peds ? Hematology ?  ?Anesthesia Other Findings ? ? Reproductive/Obstetrics ?(+) Pregnancy ? ?  ? ? ? ? ? ? ? ? ? ? ? ? ? ?  ?  ? ? ? ? ? ? ? ? ?Anesthesia Physical ?Anesthesia Plan ? ?ASA: 2 ? ?Anesthesia Plan: Epidural  ? ?Post-op Pain Management:   ? ?Induction:  ? ?PONV Risk Score and Plan: 0 ? ?Airway Management Planned: Natural Airway ? ?Additional Equipment: None ? ?Intra-op Plan:  ? ?Post-operative Plan:  ? ?Informed Consent: I have reviewed the patients History and Physical, chart, labs and discussed the procedure including the risks, benefits and alternatives for the proposed anesthesia with the patient or authorized representative who has indicated his/her understanding and acceptance.  ? ? ? ? ? ?Plan Discussed with:  ? ?Anesthesia Plan Comments: (Lab Results ?     Component                Value               Date                 ?     WBC                      8.5                 09/14/2021           ?     HGB                      10.7 (L)            09/14/2021           ?     HCT                      32.9 (L)            09/14/2021           ?     MCV                      93.7                09/14/2021           ?     PLT                      337                 09/14/2021           ?)  ? ? ? ? ? ? ?Anesthesia Quick Evaluation ? ?

## 2021-09-14 NOTE — Anesthesia Procedure Notes (Signed)
Epidural ?Patient location during procedure: OB ?Start time: 09/14/2021 9:58 PM ?End time: 09/14/2021 10:03 PM ? ?Staffing ?Anesthesiologist: Effie Berkshire, MD ?Performed: anesthesiologist  ? ?Preanesthetic Checklist ?Completed: patient identified, IV checked, site marked, risks and benefits discussed, surgical consent, monitors and equipment checked, pre-op evaluation and timeout performed ? ?Epidural ?Patient position: sitting ?Prep: DuraPrep ?Patient monitoring: heart rate, continuous pulse ox and blood pressure ?Approach: midline ?Location: L3-L4 ?Injection technique: LOR saline ? ?Needle:  ?Needle type: Tuohy  ?Needle gauge: 17 G ?Needle length: 9 cm ?Catheter type: closed end flexible ?Catheter size: 20 Guage ?Test dose: negative and 1.5% lidocaine ? ?Assessment ?Events: blood not aspirated, injection not painful, no injection resistance and no paresthesia ? ?Additional Notes ?LOR @ 5 ? ?Patient identified. Risks/Benefits/Options discussed with patient including but not limited to bleeding, infection, nerve damage, paralysis, failed block, incomplete pain control, headache, blood pressure changes, nausea, vomiting, reactions to medications, itching and postpartum back pain. Confirmed with bedside nurse the patient's most recent platelet count. Confirmed with patient that they are not currently taking any anticoagulation, have any bleeding history or any family history of bleeding disorders. Patient expressed understanding and wished to proceed. All questions were answered. Sterile technique was used throughout the entire procedure. Please see nursing notes for vital signs. Test dose was given through epidural catheter and negative prior to continuing to dose epidural or start infusion. Warning signs of high block given to the patient including shortness of breath, tingling/numbness in hands, complete motor block, or any concerning symptoms with instructions to call for help. Patient was given instructions on  fall risk and not to get out of bed. All questions and concerns addressed with instructions to call with any issues or inadequate analgesia.   ? ?Reason for block:procedure for pain ? ? ? ?

## 2021-09-14 NOTE — H&P (Signed)
Diana Espinoza is a 41 y.o. female [phone] [redacted]w[redacted]d presenting for IOL. She reports noLOF, VB, Contractions. Normal FM.  ? ?Pregnancy c/b: ?Advanced maternal age: LR NIPT ?Anemia: on iron, admission Hgb 10.7 ?History of postpartum hemorrhage: with all prior deliveries, denies every needing blood transfusion ? ?OB History   ? ? Gravida  ?6  ? Para  ?3  ? Term  ?3  ? Preterm  ?   ? AB  ?2  ? Living  ?3  ?  ? ? SAB  ?   ? IAB  ?1  ? Ectopic  ?1  ? Multiple  ?   ? Live Births  ?3  ?   ?  ?  ? ?Past Medical History:  ?Diagnosis Date  ? Abnormal Pap smear   ? Anemia   ? Anxiety   ? on meds sometimes. Coping effectively  ? Depression   ? Pancreatitis   ? Vaginal Pap smear, abnormal   ? ?Past Surgical History:  ?Procedure Laterality Date  ? CHOLECYSTECTOMY    ? ECTOPIC PREGNANCY SURGERY  2000  ? TONSILLECTOMY    ? ?Family History: family history includes Cancer in her maternal grandmother, sister, and another family member; Diabetes in her mother; Hypertension in an other family member; Pancreatitis in an other family member. ?Social History:  reports that she has never smoked. She has never used smokeless tobacco. She reports that she does not currently use alcohol. She reports that she does not currently use drugs after having used the following drugs: Marijuana. ? ? ?  ?Maternal Diabetes: No ?Genetic Screening: Normal ?Maternal Ultrasounds/Referrals: Normal ?Fetal Ultrasounds or other Referrals:  None ?Maternal Substance Abuse:  No ?Significant Maternal Medications:  None ?Significant Maternal Lab Results:  Group B Strep negative ?Other Comments:  None ? ?Review of Systems Per HPI ?Exam ?Physical Exam  ?Dilation: Fingertip ?Effacement (%): 30 ?Station: Ballotable ?Exam by:: De Blanch RN ?Blood pressure 123/70, pulse 89, temperature 98 ?F (36.7 ?C), temperature source Oral, resp. rate 16, height 5\' 4"  (1.626 m), weight 99.9 kg, last menstrual period 12/15/2020. ?GenL NAD, resting comfortably ?CVS: normal  pulses ?Lungs: nonlabored respirations ?Abd: Gravid abdomen, Leopolds 7.5# ?Ext: no calf edema or tenderness ? ?Fetal testing: 145bp, mod variability, + accels, no decels ?Toco: occasional ctx ? ?Prenatal labs: ?ABO, Rh:  ?--/--/O POS (04/07 04-28-1998) ?Antibody: NEG (04/07 0516) ?Rubella: Immune (09/30 0000) ?RPR: Nonreactive (09/30 0000)  ?HBsAg: Negative (09/30 0000)  ?HIV: Non-reactive (09/30 0000)  ?GBS: Negative/-- (03/21 0000)  ? ?Assessment/Plan: ?11-28-1991 @ [redacted]w[redacted]d, IOL AMA ?1. Fetal wellbeing: cat I tracing ?2. IOL: s/p cytotec at 0530, continue q4hrs PRN ripening. Pit/arom when favorable ?3. Pain control: epidural upon patient request ?4. H/o PPH: plan prophylactic TXA and uterotonics PRN ? ?[redacted]w[redacted]d ?09/14/2021, 9:01 AM ? ? ? ? ?

## 2021-09-14 NOTE — Progress Notes (Signed)
OB Progress Note ? ?S: feeling some cramping ? ? ?O: ?Today's Vitals  ? 09/14/21 1632 09/14/21 1857 09/14/21 1940 09/14/21 1941  ?BP: 124/78   113/69  ?Pulse: 84   78  ?Resp: 18   17  ?Temp:      ?TempSrc:      ?Weight:      ?Height:      ?PainSc:  3  0-No pain   ? ?Body mass index is 37.8 kg/m?. ? ?SVE 1/60/-3, unchanged. Foley balloon inserted and inflated with 60cc fluid ? ?FHR: 150bpm, mod variability, + accels, run of variable decels when patient on her back for balloon placement, resolved with position change ?Toco: ctx q 2-3 min ? ? ?A/P:  [id number] @ [redacted]w[redacted]d, IOL AMA ?1. Fetal wellbeing: cat I-II tracing, overall reassuring, variable decels resolve with position change ?2. IOL: s/p cytotec x 2, continue pitocin, foley balloon in place ?- Discussed slow progress with patient - has been 13 years since last delivery. She is very understanding.  ?3. Pain control: epidural upon patient request ?4. H/o PPH: plan prophylactic TXA and uterotonics PRN ? ?M. Brien Mates, MD 09/14/21 8:25 PM  ? ?

## 2021-09-14 NOTE — Progress Notes (Signed)
OB Progress Note ? ?S: Pt feeling mild occ cramping ? ? ?O: ?Today's Vitals  ? 09/14/21 1211 09/14/21 1302 09/14/21 1402 09/14/21 1410  ?BP: 109/72  121/75 117/72  ?Pulse: 89  80 78  ?Resp: 16   18  ?Temp: 98.4 ?F (36.9 ?C)     ?TempSrc: Oral     ?Weight:      ?Height:      ?PainSc: 0-No pain 1   0-No pain  ? ?Body mass index is 37.8 kg/m?. ? ?SVE 1/60/-3 ? ?FHR: 145bpm, moderate variability, + accels. No current decelerations though patient has had occasional  variable decelerations with nadir to 80s. With position change, HR has recovered quickly and very reassuring ?Toco:irregular ? ? ?A/P: 40Y [id number] @ [redacted]w[redacted]d, IOL AMA ?1. Fetal wellbeing: cat I-II tracing, overall reassuring however with occ variables ?2. IOL: s/p cytotec x 2, due to occ variables and minimal response to cytotec, will switch to pitocin. Plan AROM when favorable ?3. Pain control: epidural upon patient request ?4. H/o PPH: plan prophylactic TXA and uterotonics PRN ?  ?Charlett Nose ?09/14/2021, 2:37 PM  ? ?

## 2021-09-15 ENCOUNTER — Encounter (HOSPITAL_COMMUNITY): Payer: Self-pay | Admitting: Obstetrics and Gynecology

## 2021-09-15 ENCOUNTER — Encounter (HOSPITAL_COMMUNITY)
Admission: AD | Disposition: A | Discharge status: Home / Self Care | Payer: Self-pay | Source: Home / Self Care | Attending: Obstetrics and Gynecology

## 2021-09-15 DIAGNOSIS — Z3A39 39 weeks gestation of pregnancy: Secondary | ICD-10-CM

## 2021-09-15 LAB — TYPE AND SCREEN
ABO/RH(D): O POS
Antibody Screen: NEGATIVE

## 2021-09-15 SURGERY — Surgical Case
Anesthesia: Epidural | Wound class: Clean Contaminated

## 2021-09-15 MED ORDER — WITCH HAZEL-GLYCERIN EX PADS: 1.0000 "application " | MEDICATED_PAD | CUTANEOUS | Status: DC | PRN

## 2021-09-15 MED ORDER — LACTATED RINGERS IV SOLN: INTRAVENOUS | Status: DC

## 2021-09-15 MED ORDER — ONDANSETRON HCL 4 MG/2ML IJ SOLN: 4.0000 mg | Freq: Once | INTRAMUSCULAR | Status: DC | PRN

## 2021-09-15 MED ORDER — MORPHINE SULFATE (PF) 0.5 MG/ML IJ SOLN
INTRAMUSCULAR | Status: DC | PRN
  Administered 2021-09-15: 3 mg via EPIDURAL

## 2021-09-15 MED ORDER — DIBUCAINE (PERIANAL) 1 % EX OINT: 1.0000 "application " | TOPICAL_OINTMENT | CUTANEOUS | Status: DC | PRN

## 2021-09-15 MED ORDER — KETOROLAC TROMETHAMINE 30 MG/ML IJ SOLN
30.0000 mg | Freq: Four times a day (QID) | INTRAMUSCULAR | Status: AC
  Administered 2021-09-15 – 2021-09-16 (×4): 30 mg via INTRAVENOUS
  Filled 2021-09-15 (×4): qty 1

## 2021-09-15 MED ORDER — SENNOSIDES-DOCUSATE SODIUM 8.6-50 MG PO TABS
2.0000 | ORAL_TABLET | ORAL | Status: DC
  Administered 2021-09-15 – 2021-09-17 (×3): 2 via ORAL
  Filled 2021-09-15 (×3): qty 2

## 2021-09-15 MED ORDER — CEFAZOLIN SODIUM-DEXTROSE 2-3 GM-%(50ML) IV SOLR
INTRAVENOUS | Status: DC | PRN
  Administered 2021-09-15: 2 g via INTRAVENOUS

## 2021-09-15 MED ORDER — MEASLES, MUMPS & RUBELLA VAC IJ SOLR: 0.5000 mL | Freq: Once | INTRAMUSCULAR | Status: DC

## 2021-09-15 MED ORDER — COCONUT OIL OIL
1.0000 | TOPICAL_OIL | Status: DC | PRN
Start: 2021-09-15 — End: 2021-09-18
  Administered 2021-09-17: 1 via TOPICAL

## 2021-09-15 MED ORDER — ACETAMINOPHEN 325 MG PO TABS: 325.0000 mg | ORAL_TABLET | ORAL | Status: DC | PRN

## 2021-09-15 MED ORDER — MENTHOL 3 MG MT LOZG: 1.0000 | LOZENGE | OROMUCOSAL | Status: DC | PRN

## 2021-09-15 MED ORDER — ACETAMINOPHEN 160 MG/5ML PO SOLN: 325.0000 mg | ORAL | Status: DC | PRN

## 2021-09-15 MED ORDER — SIMETHICONE 80 MG PO CHEW
80.0000 mg | CHEWABLE_TABLET | ORAL | Status: DC | PRN
  Administered 2021-09-16: 80 mg via ORAL
  Filled 2021-09-15: qty 1

## 2021-09-15 MED ORDER — METHYLERGONOVINE MALEATE 0.2 MG/ML IJ SOLN: 0.2000 mg | INTRAMUSCULAR | Status: DC | PRN

## 2021-09-15 MED ORDER — MORPHINE SULFATE (PF) 0.5 MG/ML IJ SOLN
INTRAMUSCULAR | Status: AC
  Filled 2021-09-15: qty 10

## 2021-09-15 MED ORDER — STERILE WATER FOR IRRIGATION IR SOLN
Status: DC | PRN
  Administered 2021-09-15: 1

## 2021-09-15 MED ORDER — DIPHENHYDRAMINE HCL 25 MG PO CAPS
25.0000 mg | ORAL_CAPSULE | Freq: Four times a day (QID) | ORAL | Status: DC | PRN
  Administered 2021-09-15: 25 mg via ORAL
  Filled 2021-09-15: qty 1

## 2021-09-15 MED ORDER — TRANEXAMIC ACID-NACL 1000-0.7 MG/100ML-% IV SOLN
INTRAVENOUS | Status: AC
  Filled 2021-09-15: qty 100

## 2021-09-15 MED ORDER — ACETAMINOPHEN 500 MG PO TABS
1000.0000 mg | ORAL_TABLET | Freq: Four times a day (QID) | ORAL | Status: DC
  Administered 2021-09-15 – 2021-09-17 (×8): 1000 mg via ORAL
  Filled 2021-09-15 (×9): qty 2

## 2021-09-15 MED ORDER — DEXAMETHASONE SODIUM PHOSPHATE 4 MG/ML IJ SOLN
INTRAMUSCULAR | Status: DC | PRN
  Administered 2021-09-15: 10 mg via INTRAVENOUS

## 2021-09-15 MED ORDER — OXYTOCIN-SODIUM CHLORIDE 30-0.9 UT/500ML-% IV SOLN
INTRAVENOUS | Status: AC
  Filled 2021-09-15: qty 500

## 2021-09-15 MED ORDER — ENOXAPARIN SODIUM 60 MG/0.6ML IJ SOSY
50.0000 mg | PREFILLED_SYRINGE | INTRAMUSCULAR | Status: DC
  Administered 2021-09-16 – 2021-09-17 (×2): 50 mg via SUBCUTANEOUS
  Filled 2021-09-15 (×2): qty 0.6

## 2021-09-15 MED ORDER — CEFAZOLIN SODIUM-DEXTROSE 2-4 GM/100ML-% IV SOLN
INTRAVENOUS | Status: AC
  Filled 2021-09-15: qty 100

## 2021-09-15 MED ORDER — LACTATED RINGERS AMNIOINFUSION
INTRAVENOUS | Status: DC
  Administered 2021-09-15: 10 mL via INTRAUTERINE

## 2021-09-15 MED ORDER — IBUPROFEN 600 MG PO TABS
600.0000 mg | ORAL_TABLET | Freq: Four times a day (QID) | ORAL | Status: DC
  Administered 2021-09-16 – 2021-09-17 (×5): 600 mg via ORAL
  Filled 2021-09-15 (×5): qty 1

## 2021-09-15 MED ORDER — CALCIUM CARBONATE ANTACID 500 MG PO CHEW: 1.0000 | CHEWABLE_TABLET | Freq: Three times a day (TID) | ORAL | Status: DC

## 2021-09-15 MED ORDER — ZOLPIDEM TARTRATE 5 MG PO TABS: 5.0000 mg | ORAL_TABLET | Freq: Every evening | ORAL | Status: DC | PRN

## 2021-09-15 MED ORDER — CALCIUM CARBONATE ANTACID 500 MG PO CHEW
1.0000 | CHEWABLE_TABLET | ORAL | Status: DC | PRN
  Administered 2021-09-15: 200 mg via ORAL
  Filled 2021-09-15: qty 1

## 2021-09-15 MED ORDER — OXYTOCIN-SODIUM CHLORIDE 30-0.9 UT/500ML-% IV SOLN
INTRAVENOUS | Status: DC | PRN
Start: 2021-09-15 — End: 2021-09-15
  Administered 2021-09-15: 30 [IU] via INTRAVENOUS

## 2021-09-15 MED ORDER — TRANEXAMIC ACID-NACL 1000-0.7 MG/100ML-% IV SOLN
INTRAVENOUS | Status: DC | PRN
  Administered 2021-09-15: 1000 mg via INTRAVENOUS

## 2021-09-15 MED ORDER — OXYTOCIN-SODIUM CHLORIDE 30-0.9 UT/500ML-% IV SOLN: 2.5000 [IU]/h | INTRAVENOUS | Status: AC

## 2021-09-15 MED ORDER — SODIUM CHLORIDE 0.9 % IV SOLN
INTRAVENOUS | Status: DC | PRN
  Administered 2021-09-15: 500 mg via INTRAVENOUS

## 2021-09-15 MED ORDER — SODIUM CHLORIDE 0.9 % IR SOLN
Status: DC | PRN
Start: 2021-09-15 — End: 2021-09-15
  Administered 2021-09-15: 1

## 2021-09-15 MED ORDER — ONDANSETRON HCL 4 MG/2ML IJ SOLN
INTRAMUSCULAR | Status: DC | PRN
  Administered 2021-09-15: 4 mg via INTRAVENOUS

## 2021-09-15 MED ORDER — PRENATAL MULTIVITAMIN CH
1.0000 | ORAL_TABLET | Freq: Every day | ORAL | Status: DC
  Administered 2021-09-16 – 2021-09-17 (×2): 1 via ORAL
  Filled 2021-09-15 (×2): qty 1

## 2021-09-15 MED ORDER — DIPHENHYDRAMINE HCL 50 MG/ML IJ SOLN
INTRAMUSCULAR | Status: AC
  Filled 2021-09-15: qty 1

## 2021-09-15 MED ORDER — EPHEDRINE 5 MG/ML INJ
INTRAVENOUS | Status: AC
  Filled 2021-09-15: qty 5

## 2021-09-15 MED ORDER — FENTANYL CITRATE (PF) 100 MCG/2ML IJ SOLN
INTRAMUSCULAR | Status: AC
  Filled 2021-09-15: qty 2

## 2021-09-15 MED ORDER — FENTANYL CITRATE (PF) 100 MCG/2ML IJ SOLN
INTRAMUSCULAR | Status: DC | PRN
  Administered 2021-09-15: 100 ug via EPIDURAL

## 2021-09-15 MED ORDER — OXYCODONE HCL 5 MG/5ML PO SOLN: 5.0000 mg | Freq: Once | ORAL | Status: DC | PRN

## 2021-09-15 MED ORDER — OXYCODONE HCL 5 MG PO TABS
5.0000 mg | ORAL_TABLET | ORAL | Status: DC | PRN
  Administered 2021-09-17: 5 mg via ORAL
  Filled 2021-09-15: qty 1

## 2021-09-15 MED ORDER — LACTATED RINGERS IV SOLN: INTRAVENOUS | Status: DC | PRN

## 2021-09-15 MED ORDER — TETANUS-DIPHTH-ACELL PERTUSSIS 5-2.5-18.5 LF-MCG/0.5 IM SUSY: 0.5000 mL | PREFILLED_SYRINGE | Freq: Once | INTRAMUSCULAR | Status: DC

## 2021-09-15 MED ORDER — PHENYLEPHRINE 40 MCG/ML (10ML) SYRINGE FOR IV PUSH (FOR BLOOD PRESSURE SUPPORT)
PREFILLED_SYRINGE | INTRAVENOUS | Status: AC
Start: 2021-09-15 — End: ?
  Filled 2021-09-15: qty 10

## 2021-09-15 MED ORDER — MEPERIDINE HCL 25 MG/ML IJ SOLN: 6.2500 mg | INTRAMUSCULAR | Status: DC | PRN

## 2021-09-15 MED ORDER — METHYLERGONOVINE MALEATE 0.2 MG PO TABS: 0.2000 mg | ORAL_TABLET | ORAL | Status: DC | PRN

## 2021-09-15 MED ORDER — OXYCODONE HCL 5 MG PO TABS: 5.0000 mg | ORAL_TABLET | Freq: Once | ORAL | Status: DC | PRN

## 2021-09-15 MED ORDER — MAGNESIUM HYDROXIDE 400 MG/5ML PO SUSP: 30.0000 mL | ORAL | Status: DC | PRN

## 2021-09-15 MED ORDER — PHENYLEPHRINE HCL (PRESSORS) 10 MG/ML IV SOLN
INTRAVENOUS | Status: DC | PRN
  Administered 2021-09-15 (×3): 40 ug via INTRAVENOUS

## 2021-09-15 MED ORDER — FENTANYL CITRATE (PF) 100 MCG/2ML IJ SOLN: 25.0000 ug | INTRAMUSCULAR | Status: DC | PRN

## 2021-09-15 SURGICAL SUPPLY — 33 items
BENZOIN TINCTURE PRP APPL 2/3 (GAUZE/BANDAGES/DRESSINGS) ×1 IMPLANT
CHLORAPREP W/TINT 26ML (MISCELLANEOUS) ×4 IMPLANT
CLAMP CORD UMBIL (MISCELLANEOUS) ×2 IMPLANT
CLOTH BEACON ORANGE TIMEOUT ST (SAFETY) ×2 IMPLANT
CLSR STERI-STRIP ANTIMIC 1/2X4 (GAUZE/BANDAGES/DRESSINGS) ×1 IMPLANT
DRSG OPSITE POSTOP 4X10 (GAUZE/BANDAGES/DRESSINGS) ×2 IMPLANT
ELECT REM PT RETURN 9FT ADLT (ELECTROSURGICAL) ×2
ELECTRODE REM PT RTRN 9FT ADLT (ELECTROSURGICAL) ×1 IMPLANT
EXTRACTOR VACUUM KIWI (MISCELLANEOUS) IMPLANT
EXTRACTOR VACUUM M CUP 4 TUBE (SUCTIONS) IMPLANT
GLOVE BIOGEL PI IND STRL 7.0 (GLOVE) ×1 IMPLANT
GLOVE BIOGEL PI INDICATOR 7.0 (GLOVE) ×1
GLOVE ORTHO TXT STRL SZ7.5 (GLOVE) ×2 IMPLANT
GOWN STRL REUS W/TWL LRG LVL3 (GOWN DISPOSABLE) ×4 IMPLANT
KIT ABG SYR 3ML LUER SLIP (SYRINGE) IMPLANT
NDL HYPO 25X5/8 SAFETYGLIDE (NEEDLE) ×1 IMPLANT
NEEDLE HYPO 25X5/8 SAFETYGLIDE (NEEDLE) ×2 IMPLANT
NS IRRIG 1000ML POUR BTL (IV SOLUTION) ×2 IMPLANT
PACK C SECTION WH (CUSTOM PROCEDURE TRAY) ×2 IMPLANT
PAD OB MATERNITY 4.3X12.25 (PERSONAL CARE ITEMS) ×2 IMPLANT
PENCIL SMOKE EVAC W/HOLSTER (ELECTROSURGICAL) ×2 IMPLANT
RETAINER VISCERAL (MISCELLANEOUS) ×1 IMPLANT
RTRCTR C-SECT PINK 25CM LRG (MISCELLANEOUS) ×2 IMPLANT
SUT CHROMIC 1 CTX 36 (SUTURE) ×4 IMPLANT
SUT PLAIN 0 NONE (SUTURE) IMPLANT
SUT PLAIN 2 0 XLH (SUTURE) ×1 IMPLANT
SUT VIC AB 0 CT1 27 (SUTURE) ×2
SUT VIC AB 0 CT1 27XBRD ANBCTR (SUTURE) ×2 IMPLANT
SUT VIC AB 2-0 CT1 (SUTURE) ×2 IMPLANT
SUT VIC AB 4-0 KS 27 (SUTURE) IMPLANT
TOWEL OR 17X24 6PK STRL BLUE (TOWEL DISPOSABLE) ×2 IMPLANT
TRAY FOLEY W/BAG SLVR 14FR LF (SET/KITS/TRAYS/PACK) ×2 IMPLANT
WATER STERILE IRR 1000ML POUR (IV SOLUTION) ×2 IMPLANT

## 2021-09-15 NOTE — Progress Notes (Signed)
Comfortable with epidural ?Afeb, VSS ?FHT-130s, mod variability, + scalp stim, some early and variable decels, Cat II, ctx q 4-5 min ?VE-4/50/-3, vtx, AROM light-mod meconium, IUPC inserted ?Will continue pitocin, monitor FHT and progress, anticipate SVD ?

## 2021-09-15 NOTE — Progress Notes (Signed)
Still comfortable with epidural ?Afeb, VSS ?FHT-140s, mostly minimal variability, + scalp stim, variable or late decels after most ctx-variables improved some with amnioinfusion, now mostly lates, ctx q 4-6 min, Cat II ?VE-4/70/-2, vtx ? ?Discussed FHT with repetitive late decels remote from delivery despite position change and resuscitative measures, recommended c-section.  Discussed procedure and risks, OR notified, will proceed when OR is ready.  Plan for TXA after delivery for h/o PPH ?

## 2021-09-15 NOTE — Op Note (Signed)
Preoperative diagnosis: Intrauterine pregnancy at 39 weeks, persistent fetal heart rate decelerations ?Postoperative diagnosis: Same ?Procedure: Primary low transverse cesarean section without extensions ?Surgeon: Lavina Hamman M.D. ?Anesthesia: Epidural ?Findings: Patient had normal gravid anatomy and delivered a viable female infant with Apgars of 7 and 9 weight 5 lbs 10 oz ?Estimated blood loss: 970 cc ?Specimens: Placenta sent to labor and delivery ?Complications: None ? ?Procedure in detail: ?The patient was taken to the operating room and placed in the  dorsosupine position with left tilt. Her previously placed epidural was dosed appropriately.  Abdomen was then prepped and draped in the usual sterile fashion, a foley catheter had previously been inserted. The level of her anesthesia was found to be adequate. Abdomen was entered via a standard Pfannenstiel incision. Once the peritoneal cavity was entered the Alexis disposable self-retaining retractor was placed and good visualization was achieved. A 4 cm transverse incision was then made in the lower uterine segment pushing the bladder inferior. Once the uterine cavity was entered the incision was extended digitally, clear amniotic fluid. The fetal vertex was grasped and delivered through the incision atraumatically. Mouth and nares were suctioned. The remainder of the infant then delivered atraumatically. Cord was doubly clamped and cut after one minute and the infant handed to the awaiting pediatric team. Cord blood was obtained. The placenta delivered spontaneously. Uterus was wiped dry with clean lap pad and all clots and debris were removed. Uterine incision was inspected and found to be free of extensions. Uterine incision was closed in 1 layer with running locking #1 Chromic. Bleeding from the left angle was controlled with one figure 8 of #1 Chromic.  Tubes and ovaries were inspected and found to be normal. Uterine incision was inspected and found to  be hemostatic. Bleeding from serosal edges was controlled with electrocautery. The Alexis retractor was removed. Subfascial space was irrigated and made hemostatic with electrocautery. Peritoneum was closed with 2-0 Vicryl.  Fascia was closed in running fashion starting at both ends and meeting in the middle with 0 Vicryl. Subcutaneous tissue was then irrigated and made hemostatic with electrocautery, then closed with running 2-0 plain gut. Skin was closed with running 4-0 Vicryl subcuticular suture followed by steri-strips and a sterile dressing. Patient tolerated the procedure well and was taken to the recovery in stable condition. Counts were correct x2, she received Ancef 2 g and Zithromax 500 mg at the beginning of the procedure and she had PAS hose on throughout the procedure.  ?

## 2021-09-15 NOTE — Anesthesia Postprocedure Evaluation (Signed)
Anesthesia Post Note ? ?Patient: Diana Espinoza ? ?Procedure(s) Performed: CESAREAN SECTION ? ?  ? ?Patient location during evaluation: Mother Baby ?Anesthesia Type: Epidural ?Level of consciousness: awake and alert ?Pain management: pain level controlled ?Vital Signs Assessment: post-procedure vital signs reviewed and stable ?Respiratory status: spontaneous breathing, nonlabored ventilation and respiratory function stable ?Cardiovascular status: stable ?Postop Assessment: no headache, no backache and epidural receding ?Anesthetic complications: no ? ? ?No notable events documented. ? ?Last Vitals:  ?Vitals:  ? 09/15/21 1700 09/15/21 1820  ?BP: 111/68 112/73  ?Pulse: 86 94  ?Resp: 18   ?Temp:    ?SpO2: 95%   ?  ?Last Pain:  ?Vitals:  ? 09/15/21 1816  ?TempSrc:   ?PainSc: 5   ? ?Pain Goal:   ? ?  ?  ?  ?  ?  ?  ?  ? ?Diana Espinoza ? ? ? ? ?

## 2021-09-15 NOTE — Lactation Note (Signed)
This note was copied from a baby's chart. ?Lactation Consultation Note ? ?Patient Name: Diana Espinoza ?GHWEX'H Date: 09/15/2021 ?Reason for consult: Initial assessment;Term;Infant < 6lbs ?Age:42 hours ? ?Birth weight of 5 lbs 10 oz.  Baby has had borderline temps.  First CBG of 63. ? ?LC in to visit with P4 Mom of term infant delivered by C/S.  Mom states she would like to breastfeed this baby.  She did not breastfeed her first 3 children.  Baby dressed and swaddled and held against Mom's hip in bed while she is about to eat her dinner.  Encouraged Mom to undress baby down to a diaper and place her onto her chest with a blanket.  Mom stated she had baby STS for an hour.  Explained that baby would benefit from extended time STS due to her small size. ? ?Mom fed baby formula because she feels she doesn't have anything to give her.  Offered to show Mom how to do breast massage and hand expression.  Mom didn't respond.  Baby cueing.  Offered to help with breastfeeding and Mom didn't respond.  Mom's older daughters entered the room.   ? ?Mom informed of lactation support available to her and encouraged her to ask for help.  Talked about possibly needing to pump to help support her milk supply.  Mom nodded.  ? ?Talked with Brandee RN and she is aware that Mom may ask for [facility] tonight.   ? ?Maternal Data ?Has patient been taught Hand Expression?: No ?Does the patient have breastfeeding experience prior to this delivery?: No ? ?Feeding ?Mother's Current Feeding Choice: Breast Milk ?Nipple Type: Slow - flow ? ?LATCH Score ?Latch: Repeated attempts needed to sustain latch, nipple held in mouth throughout feeding, stimulation needed to elicit sucking reflex. ? ?Audible Swallowing: Spontaneous and intermittent ? ?Type of Nipple: Flat ? ?Comfort (Breast/Nipple): Soft / non-tender ? ?Hold (Positioning): Assistance needed to correctly position infant at breast and maintain latch. ? ?LATCH Score: 7 ? ? ?Lactation Tools  Discussed/Used ?Tools: Bottle ? ?Interventions ?Interventions: Breast feeding basics reviewed;Skin to skin;Breast massage;Hand express;LC Services brochure ? ? ?Consult Status ?Consult Status: Follow-up ?Date: 09/16/21 ?Follow-up type: In-patient ? ? ? ?Johny Blamer E ?09/15/2021, 6:39 PM ? ? ? ?

## 2021-09-15 NOTE — Transfer of Care (Signed)
Immediate Anesthesia Transfer of Care Note ? ?Patient: Diana Espinoza ? ?Procedure(s) Performed: CESAREAN SECTION ? ?Patient Location: PACU ? ?Anesthesia Type:Epidural ? ?Level of Consciousness: awake, alert  and oriented ? ?Airway & Oxygen Therapy: Patient Spontanous Breathing ? ?Post-op Assessment: Report given to RN and Post -op Vital signs reviewed and stable ? ?Post vital signs: Reviewed and stable ? ?Last Vitals:  ?Vitals Value Taken Time  ?BP    ?Temp    ?Pulse 94 09/15/21 1517  ?Resp 22 09/15/21 1517  ?SpO2 91 % 09/15/21 1517  ?Vitals shown include unvalidated device data. ? ?Last Pain:  ?Vitals:  ? 09/15/21 1300  ?TempSrc:   ?PainSc: 0-No pain  ?   ? ?  ? ?Complications: No notable events documented. ?

## 2021-09-16 LAB — CBC
HCT: 25.2 % — ABNORMAL LOW (ref 36.0–46.0)
Hemoglobin: 8.1 g/dL — ABNORMAL LOW (ref 12.0–15.0)
MCH: 30.5 pg (ref 26.0–34.0)
MCHC: 32.1 g/dL (ref 30.0–36.0)
MCV: 94.7 fL (ref 80.0–100.0)
Platelets: 257 10*3/uL (ref 150–400)
RBC: 2.66 MIL/uL — ABNORMAL LOW (ref 3.87–5.11)
RDW: 16.6 % — ABNORMAL HIGH (ref 11.5–15.5)
WBC: 16.7 10*3/uL — ABNORMAL HIGH (ref 4.0–10.5)
nRBC: 0 % (ref 0.0–0.2)

## 2021-09-16 MED ORDER — POLYSACCHARIDE IRON COMPLEX 150 MG PO CAPS
150.0000 mg | ORAL_CAPSULE | Freq: Every day | ORAL | Status: DC
  Administered 2021-09-16 – 2021-09-17 (×2): 150 mg via ORAL
  Filled 2021-09-16 (×2): qty 1

## 2021-09-16 NOTE — Progress Notes (Signed)
Subjective: ?Postpartum Day #1: Cesarean Delivery ?Patient reports incisional pain, tolerating PO, and no problems voiding.   ? ?Objective: ?Vital signs in last 24 hours: ?Temp:  [98 ?F (36.7 ?C)-99.4 ?F (37.4 ?C)] 98.2 ?F (36.8 ?C) (04/09 0532) ?Pulse Rate:  [78-96] 78 (04/09 0532) ?Resp:  [14-24] 18 (04/09 0532) ?BP: (86-121)/(60-83) 86/60 (04/09 0532) ?SpO2:  [93 %-97 %] 95 % (04/09 0532) ? ?Physical Exam:  ?General: alert ?Lochia: appropriate ?Uterine Fundus: firm ?Incision: dressing clean and dry, but bunched up ? ?Recent Labs  ?  09/14/21 ?6962 09/16/21 ?0459  ?HGB 10.7* 8.1*  ?HCT 32.9* 25.2*  ? ? ?Assessment/Plan: ?Status post Cesarean section. Doing well postoperatively.  ?Continue current care, ambulate, replace dressing, start Niferex for anemia. ? ?Diana Espinoza ?09/16/2021, 10:34 AM ? ? ?

## 2021-09-16 NOTE — Lactation Note (Addendum)
This note was copied from a baby's chart. ?Lactation Consultation Note ?Mom has only been seen formula feeding. Mom states that she is putting the baby to the breast some before giving formula but she doesn't have any milk in her breast yet. ?Mom stated she BF all of her other children and she had milk by now. ?Reviewed 3-5 day for mature milk. Mom stated she can't see colostrum. LC attempted hand expression and none noted. Breast feels heavy. Encouraged mom to keep trying to BF before giving formula and call for assistance as needed. ?Asked mom if she would like to use DEBP mom stated she would try it. ?One all ready set up in rm. Mom pumped. Praised mom. ? ? ?Patient Name: Diana Espinoza ?PZWCH'E Date: 09/16/2021 ?Reason for consult: Follow-up assessment;Term ?Age:42 hours ? ?Maternal Data ?  ? ?Feeding ?Mother's Current Feeding Choice: Breast Milk and Formula ? ?LATCH Score ?  ? ?  ? ?  ? ?  ? ?  ? ?  ? ? ?Lactation Tools Discussed/Used ?  ? ?Interventions ?  ? ?Discharge ?  ? ?Consult Status ?Consult Status: Follow-up ?Date: 09/17/21 ?Follow-up type: In-patient ? ? ? ?Charyl Dancer ?09/16/2021, 10:31 PM ? ? ? ?

## 2021-09-16 NOTE — Progress Notes (Addendum)
CSW received consult for hx of Anxiety and Depression.  CSW met with MOB to offer support and complete assessment.  When CSW arrived, MOB was bonding with infant as evidence by  engaging in skin to skin.  FOB was also present and was observing infant's and MOB's interaction. FOB appeared to be a support to the family.  CSW explained CSW's role and with MOB's permission, CSW asked FOB to step outside of the room in order to assess MOB in private; FOB left without incident.  MOB was polite, easy to engage, and receptive to meeting with CSW.   ? ?CSW asked about MOB's MH hx.  MOB openly shared that she was dx in the past (7 years ago) with bipolar however she was informed it was a missed diagnosis.  MOB however acknowledged anxiety and depression and reported that she has not had any symptoms in the past 7 years.  MOB was open to information about PMADs.  ? ?CSW provided education regarding the baby blues period vs. perinatal mood disorders, discussed treatment and gave resources for mental health follow up if concerns arise.  CSW recommends self-evaluation during the postpartum time period using the New Mom Checklist from Postpartum Progress and encouraged MOB to contact a medical professional if symptoms are noted at any time. MOB presented with insight and awareness and did not demonstrate any acute MH symptoms.  Per MOB, she has the support of FOB and their extended and immediate family members. CSW assessed for safety and MOB denied SI, HI, and DV. MOB shared feeling comfortable seeking additional help if needed.  MOB denied PMAD symptoms with her older children.  ?  ?CSW identifies no further need for intervention and no barriers to discharge at this time.  ? ?Sherissa Tenenbaum Boyd-Gilyard, MSW, LCSW ?Clinical Social Work ?[phone] ? ?

## 2021-09-17 MED ORDER — OXYCODONE HCL 5 MG PO TABS: 5.0000 mg | ORAL_TABLET | ORAL | 0 refills | Status: DC | PRN

## 2021-09-17 MED ORDER — IBUPROFEN 600 MG PO TABS: 600.0000 mg | ORAL_TABLET | Freq: Four times a day (QID) | ORAL | 0 refills | Status: DC | PRN

## 2021-09-17 NOTE — Lactation Note (Signed)
This note was copied from a baby's chart. ?Lactation Consultation Note ? ?Patient Name: Diana Espinoza ?[id number] Date: 09/17/2021 ?Reason for consult: Follow-up assessment;Term;Infant < 6lbs;Infant weight loss;Other (Comment) (11 % weight loss, per mom the baby recently fed. LC updated the doc flow sheets. per mom has pumped x1 with no results. LC reviewed BF options.) ?Age:42 hours ?Per mom when the baby latches she BF well.  ?LC stressed the importance of the feeding goal is to steadily increase the baby's weight. If latching at the breast not to spend longer than 10 mins trying to latch.  ?If baby is sluggish to start - consider feeding baby and appetizer of EBM or formula 1st and then latching for 15 -20 mins - supplement increasing volume to at least 30 ml per feeding. Post pump both breast for 15 mins , save milk for the next feeding.  ?If the baby won't latch- just feed the baby from the bottle and post pump both breast for 15 mins.  ?LC stressed the importance of the extra pumping is provided extra stimulation to establish the milk supply.  ?Due to weight loss of 11 %, D/C may be on hold.  ? ?Maternal Data ?  ? ?Feeding ?Mother's Current Feeding Choice: Breast Milk and Formula ?Nipple Type: Nfant Standard Flow (white) ? ?LATCH Score ?  ? ?  ? ?  ? ?  ? ?  ? ?  ? ? ?Lactation Tools Discussed/Used ?  ? ?Interventions ?Interventions: Breast feeding basics reviewed;Hand pump;DEBP;Education;LC Services brochure ? ?Discharge ?Discharge Education: Engorgement and breast care;Warning signs for feeding baby ?Pump: Manual;DEBP ? ?Consult Status ?Consult Status: Follow-up ?Date: 09/17/21 ?Follow-up type: In-patient ? ? ? ?Jerlyn Ly Sirenity Shew ?09/17/2021, 9:03 AM ? ? ? ?

## 2021-09-26 ENCOUNTER — Telehealth (HOSPITAL_COMMUNITY): Payer: Self-pay | Admitting: *Deleted

## 2021-09-26 NOTE — Telephone Encounter (Signed)
Attempted hospital discharge follow-up call. Left message for patient to return RN call. Deforest Hoyles, RN, 09/26/21, 1956 ?

## 2021-10-31 NOTE — Discharge Summary (Signed)
Postpartum Discharge Summary       Patient Name: Diana Espinoza DOB: [birth year] MRN: [id number]  Date of admission: 09/14/2021 Delivery date:09/15/2021  Delivering provider: Lavina Hamman  Date of discharge: 10/31/2021  Admitting diagnosis: Advanced maternal age in multigravida, third trimester [O09.523] Intrauterine pregnancy: [redacted]w[redacted]d     Secondary diagnosis:  Principal Problem:   Advanced maternal age in multigravida, third trimester     Discharge diagnosis: Term Pregnancy Delivered                                              Post partum procedures: NA Augmentation: Pitocin, Cytotec, and OP Foley Complications: None  Hospital course: Induction of Labor With Cesarean Section   41 y.o. yo [id number] at [redacted]w[redacted]d was admitted to the hospital 09/14/2021 for induction of labor. Patient had a labor course significant for . The patient went for cesarean section due to Non-Reassuring FHR. Delivery details are as follows: Membrane Rupture Time/Date: 9:36 AM ,09/15/2021   Delivery Method:C-Section, Low Transverse  Details of operation can be found in separate operative Note.  Patient had an uncomplicated postpartum course. She is ambulating, tolerating a regular diet, passing flatus, and urinating well.  Patient is discharged home in stable condition on 10/31/21.      Newborn Data: Birth date:09/15/2021  Birth time:2:25 PM  Gender:Female  Living status:Living  Apgars:7 ,9  Weight:2.55 kg                                Magnesium Sulfate received: No BMZ received: No Rhophylac:N/A   Physical exam  Vitals:   09/15/21 2259 09/16/21 0532 09/16/21 2030 09/17/21 0603  BP:  (!) 86/60 (!) 97/59 (!) 93/53  Pulse:  78 96 91  Resp:  18 20 20   Temp:  98.2 F (36.8 C) 98.6 F (37 C) 98.1 F (36.7 C)  TempSrc:  Oral Oral Oral  SpO2: 93% 95%  98%  Weight:      Height:       General: alert, cooperative, and no distress Lochia: appropriate Uterine Fundus: firm Incision: Healing well with no  significant drainage DVT Evaluation: No evidence of DVT seen on physical exam. Labs: Lab Results  Component Value Date   WBC 16.7 (H) 09/16/2021   HGB 8.1 (L) 09/16/2021   HCT 25.2 (L) 09/16/2021   MCV 94.7 09/16/2021   PLT 257 09/16/2021      Latest Ref Rng & Units 10/13/2019    5:16 PM  CMP  Glucose 70 - 99 mg/dL 98    BUN 6 - 20 mg/dL 8    Creatinine 12/13/2019 - 1.00 mg/dL 3.71    Sodium 6.96 - 789 mmol/L 136    Potassium 3.5 - 5.1 mmol/L 4.4    Chloride 98 - 111 mmol/L 99    CO2 22 - 32 mmol/L 23    Calcium 8.9 - 10.3 mg/dL 9.9    Total Protein 6.5 - 8.1 g/dL 8.5    Total Bilirubin 0.3 - 1.2 mg/dL 1.0    Alkaline Phos 38 - 126 U/L 66    AST 15 - 41 U/L 59    ALT 0 - 44 U/L 52     Edinburgh Score:    09/17/2021    1:48 PM  Edinburgh Postnatal Depression Scale Screening  Tool  I have been able to laugh and see the funny side of things. 0  I have looked forward with enjoyment to things. 0  I have blamed myself unnecessarily when things went wrong. 0  I have been anxious or worried for no good reason. 0  I have felt scared or panicky for no good reason. 0  Things have been getting on top of me. 0  I have been so unhappy that I have had difficulty sleeping. 0  I have felt sad or miserable. 0  I have been so unhappy that I have been crying. 0  The thought of harming myself has occurred to me. 0  Edinburgh Postnatal Depression Scale Total 0      After visit meds:  Allergies as of 09/17/2021   No Known Allergies      Medication List     STOP taking these medications    acetaminophen 500 MG tablet Commonly known as: TYLENOL   famotidine 20 MG tablet Commonly known as: PEPCID   ferrous sulfate 325 (65 FE) MG tablet   PRENATAL AD PO       TAKE these medications    ibuprofen 600 MG tablet Commonly known as: ADVIL Take 1 tablet (600 mg total) by mouth every 6 (six) hours as needed.   oxyCODONE 5 MG immediate release tablet Commonly known as:  Roxicodone Take 1 tablet (5 mg total) by mouth every 4 (four) hours as needed for severe pain.               Discharge Care Instructions  (From admission, onward)           Start     Ordered   09/17/21 0000  Discharge wound care:       Comments: For a cesarean delivery: You may wash incision with soap and water.  Do not soak or submerge the incision for 2 weeks. Keep incision dry. You may need to keep a sanitary pad or panty liner between the incision and your clothing for comfort and to keep the incision dry. If you note drainage, increased pain, or increased redness of the incision, then please notify your physician.   09/17/21 1853   09/17/21 0000  If the dressing is still on your incision site when you go home, remove it on the third day after your surgery date. Remove dressing if it begins to fall off, or if it is dirty or damaged before the third day.       Comments: For a cesarean delivery   09/17/21 1853             Discharge home in stable condition Infant Feeding: Breast Infant Disposition:home with mother Discharge instruction: per After Visit Summary and Postpartum booklet. Activity: Advance as tolerated. Pelvic rest for 6 weeks.  Diet: routine diet Anticipated Birth Control: Unsure Postpartum Appointment:2 weeks Future Appointments:No future appointments. Follow up Visit:      10/31/2021 Waynard Reeds, MD

## 2022-03-14 ENCOUNTER — Ambulatory Visit (HOSPITAL_COMMUNITY)
Admission: EM | Admit: 2022-03-14 | Discharge: 2022-03-14 | Disposition: A | Discharge status: Home / Self Care | Payer: Medicaid Other | Attending: Behavioral Health | Admitting: Behavioral Health

## 2022-03-14 ENCOUNTER — Emergency Department (HOSPITAL_COMMUNITY)
Admission: EM | Admit: 2022-03-14 | Discharge: 2022-03-15 | Disposition: A | Discharge status: Other Acute Inpatient Hospital | Payer: No Typology Code available for payment source | Attending: Emergency Medicine | Admitting: Emergency Medicine

## 2022-03-14 ENCOUNTER — Encounter (HOSPITAL_COMMUNITY): Payer: Self-pay | Admitting: Emergency Medicine

## 2022-03-14 ENCOUNTER — Other Ambulatory Visit: Payer: Self-pay

## 2022-03-14 DIAGNOSIS — F10120 Alcohol abuse with intoxication, uncomplicated: Secondary | ICD-10-CM | POA: Insufficient documentation

## 2022-03-14 DIAGNOSIS — Z7689 Persons encountering health services in other specified circumstances: Secondary | ICD-10-CM

## 2022-03-14 DIAGNOSIS — F10920 Alcohol use, unspecified with intoxication, uncomplicated: Secondary | ICD-10-CM

## 2022-03-14 DIAGNOSIS — I10 Essential (primary) hypertension: Secondary | ICD-10-CM | POA: Insufficient documentation

## 2022-03-14 DIAGNOSIS — F430 Acute stress reaction: Secondary | ICD-10-CM | POA: Diagnosis not present

## 2022-03-14 DIAGNOSIS — F319 Bipolar disorder, unspecified: Secondary | ICD-10-CM | POA: Insufficient documentation

## 2022-03-14 DIAGNOSIS — Z046 Encounter for general psychiatric examination, requested by authority: Secondary | ICD-10-CM

## 2022-03-14 DIAGNOSIS — F10129 Alcohol abuse with intoxication, unspecified: Secondary | ICD-10-CM | POA: Insufficient documentation

## 2022-03-14 DIAGNOSIS — Y906 Blood alcohol level of 120-199 mg/100 ml: Secondary | ICD-10-CM | POA: Insufficient documentation

## 2022-03-14 DIAGNOSIS — R451 Restlessness and agitation: Secondary | ICD-10-CM | POA: Insufficient documentation

## 2022-03-14 DIAGNOSIS — R7401 Elevation of levels of liver transaminase levels: Secondary | ICD-10-CM | POA: Diagnosis not present

## 2022-03-14 DIAGNOSIS — F1014 Alcohol abuse with alcohol-induced mood disorder: Secondary | ICD-10-CM

## 2022-03-14 DIAGNOSIS — Z20822 Contact with and (suspected) exposure to covid-19: Secondary | ICD-10-CM | POA: Diagnosis not present

## 2022-03-14 DIAGNOSIS — F32A Depression, unspecified: Secondary | ICD-10-CM | POA: Diagnosis not present

## 2022-03-14 LAB — I-STAT BETA HCG BLOOD, ED (MC, WL, AP ONLY): I-stat hCG, quantitative: <5 m[IU]/mL (ref <5)

## 2022-03-14 MED ORDER — ALUM & MAG HYDROXIDE-SIMETH 200-200-20 MG/5ML PO SUSP
30.0000 mL | Freq: Once | ORAL | Status: AC
  Administered 2022-03-15: 30 mL via ORAL
  Filled 2022-03-14: qty 30

## 2022-03-14 MED ORDER — LIDOCAINE VISCOUS HCL 2 % MT SOLN
15.0000 mL | Freq: Once | OROMUCOSAL | Status: AC
Start: 2022-03-15 — End: 2022-03-15
  Administered 2022-03-15: 15 mL via ORAL
  Filled 2022-03-14: qty 15

## 2022-03-14 NOTE — ED Notes (Signed)
Writer called Patent examiner, Lysbeth Galas, for alternate contact number for charge nurse at [facility] as Probation officer have had 7 failed attempts at calling to give report. Non-emergent EMS called to transport pt to [facility], accepting provider, Dr. Chaya Jan.

## 2022-03-14 NOTE — ED Provider Triage Note (Signed)
Emergency Medicine Provider Triage Evaluation Note  Diana Espinoza , a 41 y.o. female  was evaluated in triage.  Pt brought in by [employer].  Patient here under IVC.  Per IVC paperwork, the patient apparently threatened to "drown her baby" today.  The patient was found to be drinking at her home, the patient is currently 2 months pregnant according to IVC paperwork.  The patient states that she has been under tremendous amount of stress recently due to familial issues.  The patient denies ever making threatening comments towards her daughter.  Patient denies any medical complaints.  Review of Systems  Positive:  Negative:   Physical Exam  BP (!) 119/91   Pulse 94   Temp 98.4 F (36.9 C) (Oral)   Resp 16   Ht 5\' 4"  (1.626 m)   Wt 99.9 kg   SpO2 100%   BMI 37.80 kg/m  Gen:   Awake, no distress   Resp:  Normal effort  MSK:   Moves extremities without difficulty  Other:    Medical Decision Making  Medically screening exam initiated at 9:05 PM.  Appropriate orders placed.  Diana Espinoza was informed that the remainder of the evaluation will be completed by another provider, this initial triage assessment does not replace that evaluation, and the importance of remaining in the ED until their evaluation is complete.  IVC paperwork filled out   Azucena Cecil, [place] 03/14/22 2106

## 2022-03-14 NOTE — ED Notes (Signed)
Non-emergent EMS called for transport to [facility] 
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
## 2022-03-14 NOTE — Progress Notes (Signed)
Patient arrives by [facility] and has been IVCed by [place] Team who assessed at the scene per [employer]. IVC in process per [employer]. [employer] reports that patient made multiple suicidal statements when they arrived at the residence and per [employer], patient was said to have made statements that she wanted to drown her baby. CPS is in custody of child at this time.  Patient is observed to be impaired (Alcohol) reporting she has "been drunk everyday, like forever," although renders limited history. Patient is very agitated/angry and when asked any questions patient refuses to answer and is observed to be crying as she faces the wall. [place] was observed to require assistance on arrival walking and cannot maintain her balance while in a assessment room. Patient was seen and evaluated by [facility] NP with patient again rendering limited information and as provider attempted to obtain history patient would become agitated and yell, "No." Provider and this writer attempted to redirect patient unsuccessfully. Patient per [facility] NP will be transported to [facility] for medical clearance. TTS will assess once patient is medically cleared.

## 2022-03-14 NOTE — ED Notes (Signed)
This pt is IVC patient came in IVC FIRST EXAM DONE ALL PAPER  WORK attached to the clip board in blue zone. original paperwork in the red folder.

## 2022-03-14 NOTE — Discharge Instructions (Addendum)
Transfer to the [facility] for medical clearance.

## 2022-03-14 NOTE — ED Notes (Signed)
Attempted to call [facility] charge RN x 7 to give report on pt's arrival to their facility, with unsuccessful attempts. Receive VM stating unable to leave a message due to VM not set up.
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
## 2022-03-14 NOTE — ED Notes (Signed)
Pt escorted to [facility] via [employer], agitated. Safety maintained.

## 2022-03-14 NOTE — ED Triage Notes (Signed)
Per LEO, Pt called a Diana Espinoza and told him that she was going to "drown the baby."  The 41 year old was sent w/ her father and CPS took the 21 month old.  IVC paperwork was completed

## 2022-03-15 DIAGNOSIS — F10120 Alcohol abuse with intoxication, uncomplicated: Secondary | ICD-10-CM | POA: Diagnosis not present

## 2022-03-15 LAB — RESP PANEL BY RT-PCR (FLU A&B, COVID) ARPGX2
Influenza A by PCR: NEGATIVE
Influenza B by PCR: NEGATIVE
SARS Coronavirus 2 by RT PCR: NEGATIVE

## 2022-03-15 LAB — CBC WITH DIFFERENTIAL/PLATELET
Abs Immature Granulocytes: 0.01 10*3/uL (ref 0.00–0.07)
Basophils Absolute: 0.1 10*3/uL (ref 0.0–0.1)
Basophils Relative: 1 %
Eosinophils Absolute: 0 10*3/uL (ref 0.0–0.5)
Eosinophils Relative: 0 %
HCT: 39.3 % (ref 36.0–46.0)
Hemoglobin: 12.3 g/dL (ref 12.0–15.0)
Immature Granulocytes: 0 %
Lymphocytes Relative: 45 %
Lymphs Abs: 2.4 10*3/uL (ref 0.7–4.0)
MCH: 30.1 pg (ref 26.0–34.0)
MCHC: 31.3 g/dL (ref 30.0–36.0)
MCV: 96.1 fL (ref 80.0–100.0)
Monocytes Absolute: 0.5 10*3/uL (ref 0.1–1.0)
Monocytes Relative: 10 %
Neutro Abs: 2.4 10*3/uL (ref 1.7–7.7)
Neutrophils Relative %: 44 %
Platelets: 370 10*3/uL (ref 150–400)
RBC: 4.09 MIL/uL (ref 3.87–5.11)
RDW: 15.7 % — ABNORMAL HIGH (ref 11.5–15.5)
WBC: 5.4 10*3/uL (ref 4.0–10.5)
nRBC: 0 % (ref 0.0–0.2)

## 2022-03-15 LAB — COMPREHENSIVE METABOLIC PANEL
ALT: 67 U/L — ABNORMAL HIGH (ref 0–44)
AST: 88 U/L — ABNORMAL HIGH (ref 15–41)
Albumin: 3.9 g/dL (ref 3.5–5.0)
Alkaline Phosphatase: 79 U/L (ref 38–126)
Anion gap: 15 (ref 5–15)
BUN: 6 mg/dL (ref 6–20)
CO2: 22 mmol/L (ref 22–32)
Calcium: 8.7 mg/dL — ABNORMAL LOW (ref 8.9–10.3)
Chloride: 103 mmol/L (ref 98–111)
Creatinine, Ser: 0.7 mg/dL (ref 0.44–1.00)
GFR, Estimated: >60 mL/min (ref >60)
Glucose, Bld: 79 mg/dL (ref 70–99)
Potassium: 3.9 mmol/L (ref 3.5–5.1)
Sodium: 140 mmol/L (ref 135–145)
Total Bilirubin: 0.6 mg/dL (ref 0.3–1.2)
Total Protein: 7.5 g/dL (ref 6.5–8.1)

## 2022-03-15 LAB — RAPID URINE DRUG SCREEN, HOSP PERFORMED
Amphetamines: NOT DETECTED
Barbiturates: NOT DETECTED
Benzodiazepines: NOT DETECTED
Cocaine: NOT DETECTED
Opiates: NOT DETECTED
Tetrahydrocannabinol: NOT DETECTED

## 2022-03-15 LAB — ACETAMINOPHEN LEVEL: Acetaminophen (Tylenol), Serum: <10 ug/mL — ABNORMAL LOW (ref 10–30)

## 2022-03-15 LAB — ETHANOL: Alcohol, Ethyl (B): 165 mg/dL — ABNORMAL HIGH (ref <10)

## 2022-03-15 LAB — SALICYLATE LEVEL: Salicylate Lvl: <7 mg/dL — ABNORMAL LOW (ref 7.0–30.0)

## 2022-03-15 MED ORDER — LORAZEPAM 1 MG PO TABS: 0.0000 mg | ORAL_TABLET | Freq: Two times a day (BID) | ORAL | Status: DC

## 2022-03-15 MED ORDER — THIAMINE HCL 100 MG/ML IJ SOLN: 100.0000 mg | Freq: Every day | INTRAMUSCULAR | Status: DC

## 2022-03-15 MED ORDER — LORAZEPAM 2 MG/ML IJ SOLN: 0.0000 mg | Freq: Four times a day (QID) | INTRAMUSCULAR | Status: DC

## 2022-03-15 MED ORDER — LORAZEPAM 2 MG/ML IJ SOLN: 0.0000 mg | Freq: Two times a day (BID) | INTRAMUSCULAR | Status: DC

## 2022-03-15 MED ORDER — LORAZEPAM 1 MG PO TABS: 0.0000 mg | ORAL_TABLET | Freq: Four times a day (QID) | ORAL | Status: DC

## 2022-03-15 MED ORDER — THIAMINE MONONITRATE 100 MG PO TABS
100.0000 mg | ORAL_TABLET | Freq: Every day | ORAL | Status: DC
  Administered 2022-03-15: 100 mg via ORAL
  Filled 2022-03-15: qty 1

## 2022-03-15 NOTE — ED Notes (Signed)
Review of patient H20 Clipboard in [facility] Zone reveals IVC dated 03/14/22, which will expire 03/21/22.

## 2022-03-15 NOTE — ED Notes (Signed)
Sheriffs department at bedside, IVC papers and all other transfer paperwork handed over with pt's belongings

## 2022-03-15 NOTE — ED Provider Notes (Signed)
[facility] EMERGENCY DEPARTMENT Provider Note   CSN: [id number] Arrival date & time: 03/14/22  1919     History  Chief Complaint  Patient presents with   Alcohol Intoxication    Diana Espinoza is a 41 y.o. female patient presents under IVC by [employer], in transfer from [facility].  Per report and review of chart from [facility] provider note, [employer]PD and CPS were called to the patient's home earlier in the day due to reports that the patient was threatening to drown her 56-month-old infant.  At time of their arrival patient was reportedly severely intoxicated from alcohol and was the sole care provider at that time for her infant.  CPS took custody of the child on scene and patient was evaluated by behavioral health response team from [facility], at that time she was making suicidal threats and stating that she was going to drown her infant.  For that reason she was placed under IVC.  Transferred from behavioral health urgent care for medical clearance and completion of psychiatric evaluation.  My interaction with the patient began approximately 9 hours after her arrival to the emergency department.  At this time she is no longer acutely intoxicated with alcohol, adamantly denies any suicidal or homicidal thoughts, states that these only occurred because she was drunk.  She states that she has been going through a rough time as her best friend just recently died.  She is sad that CPS took custody of her child.  Personally reviewed her medical records.  History of anxiety and depression.  States has been drinking one of the oversized bottles of wine per day for the last few days.  States that prior to that she had not been drinking for some time though she cannot quantify this.  HPI     Home Medications Prior to Admission medications   Medication Sig Start Date End Date Taking? Authorizing Provider  ibuprofen (ADVIL) 600 MG tablet Take 1 tablet (600 mg total) by mouth every 6  (six) hours as needed. 09/17/21   Waynard Reeds, MD  oxyCODONE (ROXICODONE) 5 MG immediate release tablet Take 1 tablet (5 mg total) by mouth every 4 (four) hours as needed for severe pain. 09/17/21   Waynard Reeds, MD      Allergies    Patient has no known allergies.    Review of Systems   Review of Systems  Psychiatric/Behavioral:  Positive for agitation and suicidal ideas.        Homicidal threats toward her infant    Physical Exam Updated Vital Signs BP (!) 119/91   Pulse 94   Temp 98.4 F (36.9 C) (Oral)   Resp 16   Ht 5\' 4"  (1.626 m)   Wt 99.9 kg   SpO2 100%   BMI 37.80 kg/m  Physical Exam Vitals and nursing note reviewed.  Constitutional:      Appearance: She is obese. She is not ill-appearing or toxic-appearing.  HENT:     Head: Normocephalic and atraumatic.     Mouth/Throat:     Mouth: Mucous membranes are moist.     Pharynx: No oropharyngeal exudate or posterior oropharyngeal erythema.  Eyes:     General:        Right eye: No discharge.        Left eye: No discharge.     Conjunctiva/sclera: Conjunctivae normal.  Cardiovascular:     Rate and Rhythm: Normal rate and regular rhythm.     Pulses: Normal pulses.  Heart sounds: Normal heart sounds. No murmur heard. Pulmonary:     Effort: Pulmonary effort is normal. No respiratory distress.     Breath sounds: Normal breath sounds. No wheezing or rales.  Abdominal:     General: Bowel sounds are normal. There is no distension.     Palpations: Abdomen is soft.     Tenderness: There is no abdominal tenderness. There is no guarding or rebound.  Musculoskeletal:        General: No deformity.     Cervical back: Neck supple.     Right lower leg: No edema.  Skin:    General: Skin is warm and dry.     Capillary Refill: Capillary refill takes less than 2 seconds.  Neurological:     General: No focal deficit present.     Mental Status: She is alert and oriented to person, place, and time. Mental status is at baseline.   Psychiatric:        Mood and Affect: Mood is depressed.        Speech: Speech normal.        Behavior: Behavior normal. Behavior is cooperative.        Thought Content: Thought content does not include homicidal or suicidal ideation.     Comments: Patient denying any suicidal or homicidal thoughts at this time.      ED Results / Procedures / Treatments   Labs (all labs ordered are listed, but only abnormal results are displayed) Labs Reviewed  RESP PANEL BY RT-PCR (FLU A&B, COVID) ARPGX2  RAPID URINE DRUG SCREEN, HOSP PERFORMED  COMPREHENSIVE METABOLIC PANEL  ETHANOL  CBC WITH DIFFERENTIAL/PLATELET  SALICYLATE LEVEL  ACETAMINOPHEN LEVEL  I-STAT BETA HCG BLOOD, ED ([facility], [facility], [facility] ONLY)    EKG None  Radiology No results found.  Procedures Procedures    Medications Ordered in ED Medications  alum & mag hydroxide-simeth (MAALOX/MYLANTA) 200-200-20 MG/5ML suspension 30 mL (30 mLs Oral Given 03/15/22 0014)    And  lidocaine (XYLOCAINE) 2 % viscous mouth solution 15 mL (15 mLs Oral Given 03/15/22 0014)    ED Course/ Medical Decision Making/ A&P                           Medical Decision Making  41 year old female presents under IVC for suicidal and homicidal threats  Hypertensive on intake and vital signs otherwise normal.  Cardiopulmonary and abdominal exams are benign.  Patient is calm and cooperative to my evaluation, does not appear to be responding to internal stimuli.  Denies any suicidality or homicidality at this time.  Will proceed with medical clearance at this time; will also place patient on CIWA protocol due to report of hx of ETOH abuse.   Amount and/or Complexity of Data Reviewed Labs: ordered.    Details: CBC without leukocytosis or anemia, CMP with transaminitis with AST/ALT 88/67, elevated in the past for this patient as well in context of chronic alcohol use.  Acetaminophen and salicylate levels are normal.  Alcohol level elevated to 165. RVP negative.   ECG/medicine tests:     Details: EKG sinus rhythm, prolonged QT.   Risk OTC drugs. Prescription drug management.   PATIENT IS MEDICALLY CLEARED, ready for TTS evaluation. Dispo pending psych recommendation. Patient remains calm and cooperative at this time, no active complaints.   Diana Espinoza  voiced understanding of her medical evaluation and treatment plan. Each of their questions answered to their expressed satisfaction.  This chart was dictated using voice recognition software, Dragon. Despite the best efforts of this provider to proofread and correct errors, errors may still occur which can change documentation meaning.  Final Clinical Impression(s) / ED Diagnoses Final diagnoses:  None    Rx / DC Orders ED Discharge Orders     None         Paris Lore, PA-C 03/15/22 0453    [name], MD 03/16/22 [phone]

## 2022-03-15 NOTE — ED Notes (Signed)
Sheriffs department contacted for transportation

## 2022-03-15 NOTE — Progress Notes (Signed)
Inpatient Behavioral Health Placement  At the direction of Dr. Dwyane Dee pt was requested for [facility] to review pt. This CSW also sent referral to out of network providers. Referral was sent to the following facilities;   Destination Service Provider Address Phone Fax  [address] [place] [address] [phone]  [facility]  [place], [place] [address] [place]  [facility]  [address] [place] [address] [place]  [facility]  [address], [place] [address] [phone]  [facility]  [place], [place] [address] [phone]  [place] [address] [place] [address] [place]  [facility]  [address] [place] [address] [phone]  [facility]  [address], [place] [address] [phone]  [address] [place] [address] [phone]  [facility]  [place] [address] [phone]  [facility]  [address] [place] [address] [phone] [facility]  [address] [place] [address] [phone]  [facility]  [address] [place] [address] [phone] [facility]  [address], [place] [address] [phone]  [facility]  [address] [place] [address] [phone]    Situation ongoing,  CSW will follow up.   Benjaman Kindler, MSW, LCSWA 03/15/2022  @ 1:22 PM
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
## 2022-03-15 NOTE — Progress Notes (Signed)
Pt was accepted To [facility] 03/15/22; Main [facility]  Pt meets inpatient criteria per Dr. Dwyane Dee  Attending Physician will be Dr. Jonelle Sports  Report can be called to: Pager [phone]: Must leave message with return phone number to receive a call back.  Pt can arrive after: BED IS READY  Care Team notified: [facility], RN, Merlyn Lot, NP, Jimmey Ralph, RN, and Dr. Gentry Roch, [place] 03/15/2022 @ 1:28 PM

## 2022-03-15 NOTE — ED Provider Notes (Signed)
Emergency Medicine Observation Re-evaluation Note  Diana Espinoza is a 41 y.o. female, seen on rounds today.  Pt initially presented to the ED for complaints of ivc, and etoh use. Pt reportedly making threats of harming self and family member.  No new c/o currently. [name] has assessed and rec inpatient bh tx.   Physical Exam  BP 125/74 (BP Location: Left Arm)   Pulse 88   Temp 98.6 F (37 C) (Oral)   Resp 20   Ht 1.626 m (5\' 4" )   Wt 99.9 kg   SpO2 100%   BMI 37.80 kg/m  Physical Exam General: calm, content, no distress.  Cardiac: regular rate.  Lungs: breathing comfortably.  Psych: alert, content. Currently does not appear to be responding to internal stimuli.  ED Course / MDM    I have reviewed the labs performed to date as well as medications administered while in observation.  Recent changes in the last 24 hours include ED obs, BH assessment.   Plan  [name] recommends inpt psych tx.   [name] indicates pt accepted to [facility], Dr Selinda Flavin.  Pt currently appears stable for transport/transfer.     Diana Saver, MD 03/15/22 [phone]

## 2022-04-10 ENCOUNTER — Encounter (HOSPITAL_COMMUNITY): Payer: Self-pay

## 2022-04-10 ENCOUNTER — Ambulatory Visit (INDEPENDENT_AMBULATORY_CARE_PROVIDER_SITE_OTHER): Payer: Medicaid Other | Admitting: Mental Health

## 2022-04-10 DIAGNOSIS — F4310 Post-traumatic stress disorder, unspecified: Secondary | ICD-10-CM | POA: Insufficient documentation

## 2022-04-10 DIAGNOSIS — F1011 Alcohol abuse, in remission: Secondary | ICD-10-CM | POA: Insufficient documentation

## 2022-04-10 DIAGNOSIS — F322 Major depressive disorder, single episode, severe without psychotic features: Secondary | ICD-10-CM | POA: Insufficient documentation

## 2022-04-10 DIAGNOSIS — F1010 Alcohol abuse, uncomplicated: Secondary | ICD-10-CM | POA: Insufficient documentation
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
## 2022-04-11 NOTE — Plan of Care (Signed)
  Problem: Depression CCP Problem  1  Goal: LTG: Naia WILL SCORE LESS THAN 10 ON THE PATIENT HEALTH QUESTIONNAIRE (PHQ-9) Outcome: Initial Goal: STG: Telma will increase management of mood (depression/anxiety)AEB development of effective coping skills with ability to reframe maladaptive thinking patterns daily within the next 6 months  Outcome: Initial

## 2022-04-23 ENCOUNTER — Ambulatory Visit (HOSPITAL_COMMUNITY): Payer: Medicaid Other | Admitting: Psychiatry

## 2022-04-26 ENCOUNTER — Telehealth (HOSPITAL_COMMUNITY): Payer: Self-pay | Admitting: Mental Health

## 2022-04-26 ENCOUNTER — Ambulatory Visit (HOSPITAL_COMMUNITY): Payer: No Typology Code available for payment source | Admitting: Mental Health

## 2022-04-26 ENCOUNTER — Encounter (HOSPITAL_COMMUNITY): Payer: Self-pay

## 2022-04-26 NOTE — Telephone Encounter (Signed)
Pt failed to connect to Caregility after x 10 minutes after therapist sent link. Therapist contacted via telephone, no answer. Left message to reschedule.

## 2022-04-28 NOTE — Patient Instructions (Signed)
Thank you for attending your appointment today.  -- Continue Lexapro 10 mg daily -- START gabapentin 100 mg twice daily as needed for anxiety/panic attacks -- STOP hydroxyzine and naltrexone at this time -- Continue other medications as prescribed.  Please do not make any changes to medications without first discussing with your provider. If you are experiencing a psychiatric emergency, please call 911 or present to your nearest emergency department. Additional crisis, medication management, and therapy resources are included below.  [facility]  [address] [place], [place] [address] [phone] WALK-IN URGENT CARE 24/7 FOR ANYONE [address] [place], [place]  [phone] Fax: [phone] [email or web address] *Interpreters available *Accepts all insurance and uninsured for Urgent Care needs *Accepts Medicaid and uninsured for outpatient treatment (below)      ONLY FOR [facility]  Below:    Outpatient New Patient Assessment/Therapy Walk-ins:        Monday -Thursday 8am until slots are full.        Every Friday 1pm-4pm  (first come, first served)                   New Patient Psychiatry/Medication Management        Monday-Friday 8am-11am (first come, first served)               For all walk-ins we ask that you arrive by 7:15am, because patients will be seen in the order of arrival.
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
## 2022-04-29 ENCOUNTER — Ambulatory Visit (INDEPENDENT_AMBULATORY_CARE_PROVIDER_SITE_OTHER): Payer: Medicaid Other | Admitting: Psychiatry

## 2022-04-29 ENCOUNTER — Encounter (HOSPITAL_COMMUNITY): Payer: Self-pay | Admitting: Psychiatry

## 2022-04-29 DIAGNOSIS — F4310 Post-traumatic stress disorder, unspecified: Secondary | ICD-10-CM

## 2022-04-29 DIAGNOSIS — F339 Major depressive disorder, recurrent, unspecified: Secondary | ICD-10-CM

## 2022-04-29 DIAGNOSIS — F1010 Alcohol abuse, uncomplicated: Secondary | ICD-10-CM | POA: Diagnosis not present

## 2022-04-29 MED ORDER — GABAPENTIN 100 MG PO CAPS: 100.0000 mg | ORAL_CAPSULE | Freq: Two times a day (BID) | ORAL | 2 refills | Status: DC | PRN

## 2022-04-29 MED ORDER — ESCITALOPRAM OXALATE 10 MG PO TABS: 10.0000 mg | ORAL_TABLET | Freq: Every day | ORAL | 2 refills | Status: DC

## 2022-05-20 ENCOUNTER — Telehealth: Payer: Medicaid Other | Admitting: Physician Assistant

## 2022-05-20 DIAGNOSIS — K047 Periapical abscess without sinus: Secondary | ICD-10-CM

## 2022-05-20 MED ORDER — IBUPROFEN 800 MG PO TABS: 800.0000 mg | ORAL_TABLET | Freq: Three times a day (TID) | ORAL | 0 refills | Status: DC | PRN

## 2022-05-20 MED ORDER — AMOXICILLIN 500 MG PO CAPS: 500.0000 mg | ORAL_CAPSULE | Freq: Three times a day (TID) | ORAL | 0 refills | Status: DC

## 2022-05-20 NOTE — Patient Instructions (Signed)
[name], thank you for joining Margaretann Loveless, PA-C for today's virtual visit.  While this provider is not your primary care provider (PCP), if your PCP is located in our provider database this encounter information will be shared with them immediately following your visit.   A [place] MyChart account gives you access to today's visit and all your visits, tests, and labs performed at [facility] " click here if you don't have a  MyChart account or go to [email or web address]  Consent: (Patient) Diana Espinoza provided verbal consent for this virtual visit at the beginning of the encounter.  Current Medications:  Current Outpatient Medications:    amoxicillin (AMOXIL) 500 MG capsule, Take 1 capsule (500 mg total) by mouth 3 (three) times daily for 10 days., Disp: 30 capsule, Rfl: 0   ibuprofen (ADVIL) 800 MG tablet, Take 1 tablet (800 mg total) by mouth every 8 (eight) hours as needed., Disp: 30 tablet, Rfl: 0   escitalopram (LEXAPRO) 10 MG tablet, Take 1 tablet (10 mg total) by mouth daily., Disp: 30 tablet, Rfl: 2   gabapentin (NEURONTIN) 100 MG capsule, Take 1 capsule (100 mg total) by mouth 2 (two) times daily as needed (anxiety/panic attacks)., Disp: 60 capsule, Rfl: 2   Medications ordered in this encounter:  Meds ordered this encounter  Medications   amoxicillin (AMOXIL) 500 MG capsule    Sig: Take 1 capsule (500 mg total) by mouth 3 (three) times daily for 10 days.    Dispense:  30 capsule    Refill:  0    Order Specific Question:   Supervising Provider    Answer:   Merrilee Jansky [id number]   ibuprofen (ADVIL) 800 MG tablet    Sig: Take 1 tablet (800 mg total) by mouth every 8 (eight) hours as needed.    Dispense:  30 tablet    Refill:  0    Order Specific Question:   Supervising Provider    Answer:   Merrilee Jansky [id number]     *If you need refills on other medications prior to your next appointment, please contact your  pharmacy*  Follow-Up: Call back or seek an in-person evaluation if the symptoms worsen or if the condition fails to improve as anticipated.  [facility] Virtual Care [phone]  Other Instructions  [facility] at [address] [place], [place], [place] [address].For appointments and more information, call [phone]. DenTemp for broken tooth  Dental Abscess  A dental abscess is an area of pus in or around a tooth. It comes from an infection. It can cause pain and other symptoms. Treatment will help with symptoms and prevent the infection from spreading. What are the causes? This condition is caused by an infection in or around the tooth. This can be from: Very bad tooth decay (cavities). A bad injury to the tooth, such as a broken or chipped tooth. What increases the risk? The risk to get an abscess is higher in males. It is also more likely in people who: Have dental decay. Have very bad gum disease. Eat sugary snacks between meals. Use tobacco. Have diabetes. Have a weak disease-fighting system (immune system). Do not brush their teeth regularly. What are the signs or symptoms? Some mild symptoms are: Tenderness. Bad breath. Fever. A sharp, sour taste in the mouth. Pain in and around the infected tooth. Worse symptoms of this condition include: Swollen neck glands. Chills. Pus draining around the tooth. Swelling and redness around the  tooth, the mouth, or the face. Very bad pain in and around the tooth. The worst symptoms can include: Difficulty swallowing. Difficulty opening your mouth. Feeling like you may vomit or vomiting. How is this treated? This is treated by getting rid of the infection. Your dentist will discuss ways to do this, including: Antibiotic medicines. Antibacterial mouth rinse. An incision in the abscess to drain out the pus. A root canal. Removing the tooth. Follow these instructions at home: Medicines Take over-the-counter  and prescription medicines only as told by your dentist. If you were prescribed an antibiotic medicine, take it as told by your dentist. Do not stop taking it even if you start to feel better. If you were prescribed a gel that has numbing medicine in it, use it exactly as told. Ask your dentist if you should avoid driving or using machines while you are taking your medicine. General instructions Rinse your mouth often with salt water. To make salt water, dissolve -1 tsp (3-6 g) of salt in 1 cup (237 mL) of warm water. Eat a soft diet while your mouth is healing. Drink enough fluid to keep your pee (urine) pale yellow. Do not apply heat to the outside of your mouth. Do not smoke or use any products that contain nicotine or tobacco. If you need help quitting, ask your dentist. Keep all follow-up visits. Prevent an abscess Brush your teeth every morning and every night. Use fluoride toothpaste. Floss your teeth each day. Get dental cleanings as often as told by your dentist. Think about getting dental sealant put on teeth that have deep holes (decay). Drink water that has fluoride in it. Most tap water has fluoride. Check the label on bottled water to see if it has fluoride in it. Drink water instead of sugary drinks. Eat healthy meals and snacks. Wear a mouth guard or face shield when you play sports. Contact a doctor if: Your pain is worse and medicine does not help. Get help right away if: You have a fever or chills. Your symptoms suddenly get worse. You have a very bad headache. You have problems breathing or swallowing. You have trouble opening your mouth. You have swelling in your neck or close to your eye. These symptoms may be an emergency. Get help right away. Call your local emergency services (911 in the [place] Do not wait to see if the symptoms will go away. Do not drive yourself to the hospital. Summary A dental abscess is an area of pus in or around a tooth. It is  caused by an infection. Treatment will help with symptoms and prevent the infection from spreading. Take over-the-counter and prescription medicines only as told by your dentist. To prevent an abscess, take good care of your teeth. Brush your teeth every morning and night. Use floss every day. Get dental cleanings as often as told by your dentist. This information is not intended to replace advice given to you by your health care provider. Make sure you discuss any questions you have with your health care provider. Document Revised: 08/03/2020 Document Reviewed: 08/03/2020 Elsevier Patient Education  2023 Elsevier Inc.    If you have been instructed to have an in-person evaluation today at a local Urgent Care facility, please use the link below. It will take you to a list of all of our available [facility], including address, phone number and hours of operation. Please do not delay care.  [facility]  If you or a family  member do not have a primary care provider, use the link below to schedule a visit and establish care. When you choose a [facility] primary care physician or advanced practice provider, you gain a long-term partner in health. Find a Primary Care Provider  Learn more about [facility] [place]'s in-office and virtual care options: [facility]

## 2022-05-20 NOTE — Progress Notes (Signed)
Virtual Visit Consent   Diana Espinoza, you are scheduled for a virtual visit with a [facility] provider today. Just as with appointments in the office, your consent must be obtained to participate. Your consent will be active for this visit and any virtual visit you may have with one of our providers in the next 365 days. If you have a MyChart account, a copy of this consent can be sent to you electronically.  As this is a virtual visit, video technology does not allow for your provider to perform a traditional examination. This may limit your provider's ability to fully assess your condition. If your provider identifies any concerns that need to be evaluated in person or the need to arrange testing (such as labs, EKG, etc.), we will make arrangements to do so. Although advances in technology are sophisticated, we cannot ensure that it will always work on either your end or our end. If the connection with a video visit is poor, the visit may have to be switched to a telephone visit. With either a video or telephone visit, we are not always able to ensure that we have a secure connection.  By engaging in this virtual visit, you consent to the provision of healthcare and authorize for your insurance to be billed (if applicable) for the services provided during this visit. Depending on your insurance coverage, you may receive a charge related to this service.  I need to obtain your verbal consent now. Are you willing to proceed with your visit today? Diana Espinoza has provided verbal consent on 05/20/2022 for a virtual visit (video or telephone). Margaretann Loveless, PA-C  Date: 05/20/2022 4:22 PM  Virtual Visit via Video Note   I, Margaretann Loveless, connected with  Diana Espinoza  ([id number], [birth year]) on 05/20/22 at  4:15 PM EST by a video-enabled telemedicine application and verified that I am speaking with the correct person using two identifiers.  Location: Patient: Virtual Visit  Location Patient: Mobile Provider: Virtual Visit Location Provider: Home Office   I discussed the limitations of evaluation and management by telemedicine and the availability of in person appointments. The patient expressed understanding and agreed to proceed.    History of Present Illness: Diana Espinoza is a 41 y.o. who identifies as a female who was assigned female at birth, and is being seen today for dental pain.  HPI: Dental Pain  This is a new problem. The current episode started in the past 7 days (Started about 6 months ago). The problem occurs constantly. The problem has been gradually worsening. The pain is moderate. Associated symptoms include thermal sensitivity. Pertinent negatives include no difficulty swallowing, facial pain, fever or sinus pressure. She has tried acetaminophen Paramedic) for the symptoms. The treatment provided no relief.     Problems:  Patient Active Problem List   Diagnosis Date Noted   PTSD (post-traumatic stress disorder) 04/10/2022   Alcohol use disorder, mild, abuse 04/10/2022   Advanced maternal age in multigravida, third trimester 09/14/2021   MDD (major depressive disorder), recurrent episode (HCC) 12/07/2018   Pancreatitis     Allergies:  Allergies  Allergen Reactions   Avocado Other (See Comments)    Pancreatitis   Medications:  Current Outpatient Medications:    amoxicillin (AMOXIL) 500 MG capsule, Take 1 capsule (500 mg total) by mouth 3 (three) times daily for 10 days., Disp: 30 capsule, Rfl: 0   ibuprofen (ADVIL) 800 MG tablet, Take 1 tablet (800 mg total)  by mouth every 8 (eight) hours as needed., Disp: 30 tablet, Rfl: 0   escitalopram (LEXAPRO) 10 MG tablet, Take 1 tablet (10 mg total) by mouth daily., Disp: 30 tablet, Rfl: 2   gabapentin (NEURONTIN) 100 MG capsule, Take 1 capsule (100 mg total) by mouth 2 (two) times daily as needed (anxiety/panic attacks)., Disp: 60 capsule, Rfl: 2  Observations/Objective: Patient is  well-developed, well-nourished in no acute distress.  Resting comfortably Head is normocephalic, atraumatic.  No labored breathing.  Speech is clear and coherent with logical content.  Patient is alert and oriented at baseline.    Assessment and Plan: 1. Dental infection - amoxicillin (AMOXIL) 500 MG capsule; Take 1 capsule (500 mg total) by mouth 3 (three) times daily for 10 days.  Dispense: 30 capsule; Refill: 0 - ibuprofen (ADVIL) 800 MG tablet; Take 1 tablet (800 mg total) by mouth every 8 (eight) hours as needed.  Dispense: 30 tablet; Refill: 0  - Suspected infection with broken tooth - Amoxicillin and ibuprofen prescribed - Can use ice on outside jaw/cheek for swelling - Can also take tylenol for pain with other medications - Discussed DenTemp putty that can be used to cover a broken tooth - Schedule a follow with a dentist as soon as possible (Can contact [facility] or [facility] [[phone]] associated with [facility] if underinsured or uninsured) - Seek in person evaluation if symptoms fail to improve or if they worsen   Follow Up Instructions: I discussed the assessment and treatment plan with the patient. The patient was provided an opportunity to ask questions and all were answered. The patient agreed with the plan and demonstrated an understanding of the instructions.  A copy of instructions were sent to the patient via MyChart unless otherwise noted below.    The patient was advised to call back or seek an in-person evaluation if the symptoms worsen or if the condition fails to improve as anticipated.  Time:  I spent 8 minutes with the patient via telehealth technology discussing the above problems/concerns.    Mar Daring, PA-C

## 2022-05-21 ENCOUNTER — Ambulatory Visit (INDEPENDENT_AMBULATORY_CARE_PROVIDER_SITE_OTHER): Payer: Medicaid Other | Admitting: Mental Health

## 2022-05-21 ENCOUNTER — Telehealth: Payer: Medicaid Other | Admitting: Physician Assistant

## 2022-05-21 DIAGNOSIS — F4310 Post-traumatic stress disorder, unspecified: Secondary | ICD-10-CM

## 2022-05-21 DIAGNOSIS — F1010 Alcohol abuse, uncomplicated: Secondary | ICD-10-CM

## 2022-05-21 DIAGNOSIS — F322 Major depressive disorder, single episode, severe without psychotic features: Secondary | ICD-10-CM

## 2022-05-21 DIAGNOSIS — H8110 Benign paroxysmal vertigo, unspecified ear: Secondary | ICD-10-CM

## 2022-05-21 MED ORDER — MECLIZINE HCL 25 MG PO TABS: 25.0000 mg | ORAL_TABLET | Freq: Three times a day (TID) | ORAL | 0 refills | Status: DC | PRN
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
## 2022-05-21 NOTE — Progress Notes (Signed)
Virtual Visit Consent   Diana Espinoza, you are scheduled for a virtual visit with a [facility] provider today. Just as with appointments in the office, your consent must be obtained to participate. Your consent will be active for this visit and any virtual visit you may have with one of our providers in the next 365 days. If you have a MyChart account, a copy of this consent can be sent to you electronically.  As this is a virtual visit, video technology does not allow for your provider to perform a traditional examination. This may limit your provider's ability to fully assess your condition. If your provider identifies any concerns that need to be evaluated in person or the need to arrange testing (such as labs, EKG, etc.), we will make arrangements to do so. Although advances in technology are sophisticated, we cannot ensure that it will always work on either your end or our end. If the connection with a video visit is poor, the visit may have to be switched to a telephone visit. With either a video or telephone visit, we are not always able to ensure that we have a secure connection.  By engaging in this virtual visit, you consent to the provision of healthcare and authorize for your insurance to be billed (if applicable) for the services provided during this visit. Depending on your insurance coverage, you may receive a charge related to this service.  I need to obtain your verbal consent now. Are you willing to proceed with your visit today? Diana Espinoza has provided verbal consent on 05/21/2022 for a virtual visit (video or telephone). Margaretann Loveless, PA-C  Date: 05/21/2022 1:14 PM  Virtual Visit via Video Note   I, Margaretann Loveless, connected with  Diana Espinoza  ([id number], 10-22-1980) on 05/21/22 at  1:00 PM EST by a video-enabled telemedicine application and verified that I am speaking with the correct person using two identifiers.  Location: Patient: Virtual Visit  Location Patient: Other: work; isolated Provider: Engineer, mining Provider: Home Office   I discussed the limitations of evaluation and management by telemedicine and the availability of in person appointments. The patient expressed understanding and agreed to proceed.    History of Present Illness: Diana Espinoza is a 41 y.o. who identifies as a female who was assigned female at birth, and is being seen today for dizziness. Was seen yesterday, 05/20/22, for possible dental infection and started on Amoxicillin and Ibuprofen for pain.  HPI: Dizziness This is a new problem. The current episode started yesterday (last night and today). The problem occurs intermittently (with head movements). The problem has been gradually worsening. Associated symptoms include congestion, headaches, nausea, neck pain (pain on left side of neck and shoulder) and a visual change (blurred vision). Pertinent negatives include no chills, coughing, fever, myalgias, numbness, sore throat, vomiting or weakness. She has tried acetaminophen and NSAIDs for the symptoms. The treatment provided no relief.    Newborn has RSV. Illness at work. Already on Amoxicillin (just yesterday) for dental infection.   Problems:  Patient Active Problem List   Diagnosis Date Noted   Severe major depression, single episode (HCC) 04/10/2022   PTSD (post-traumatic stress disorder) 04/10/2022   Alcohol use disorder, mild, abuse 04/10/2022   Advanced maternal age in multigravida, third trimester 09/14/2021   MDD (major depressive disorder), recurrent episode (HCC) 12/07/2018   Pancreatitis     Allergies:  Allergies  Allergen Reactions   Avocado Other (See Comments)  Pancreatitis   Medications:  Current Outpatient Medications:    meclizine (ANTIVERT) 25 MG tablet, Take 1 tablet (25 mg total) by mouth 3 (three) times daily as needed for dizziness., Disp: 30 tablet, Rfl: 0   amoxicillin (AMOXIL) 500 MG capsule, Take 1 capsule (500  mg total) by mouth 3 (three) times daily for 10 days., Disp: 30 capsule, Rfl: 0   escitalopram (LEXAPRO) 10 MG tablet, Take 1 tablet (10 mg total) by mouth daily., Disp: 30 tablet, Rfl: 2   gabapentin (NEURONTIN) 100 MG capsule, Take 1 capsule (100 mg total) by mouth 2 (two) times daily as needed (anxiety/panic attacks)., Disp: 60 capsule, Rfl: 2   ibuprofen (ADVIL) 800 MG tablet, Take 1 tablet (800 mg total) by mouth every 8 (eight) hours as needed., Disp: 30 tablet, Rfl: 0  Observations/Objective: Patient is well-developed, well-nourished in no acute distress.  Resting comfortably  Head is normocephalic, atraumatic.  No labored breathing.  Speech is clear and coherent with logical content.  Patient is alert and oriented at baseline.  Neuro exam grossly intact  Assessment and Plan: 1. Benign paroxysmal positional vertigo, unspecified laterality - meclizine (ANTIVERT) 25 MG tablet; Take 1 tablet (25 mg total) by mouth 3 (three) times daily as needed for dizziness.  Dispense: 30 tablet; Refill: 0  - Meclizine added - Continue Amoxicillin - Can add sudafed OTC - Epley maneuver and Brandt-Daroff exercises provided via AVS - Seek in person evaluation if worsening  Follow Up Instructions: I discussed the assessment and treatment plan with the patient. The patient was provided an opportunity to ask questions and all were answered. The patient agreed with the plan and demonstrated an understanding of the instructions.  A copy of instructions were sent to the patient via MyChart unless otherwise noted below.    The patient was advised to call back or seek an in-person evaluation if the symptoms worsen or if the condition fails to improve as anticipated.  Time:  I spent 10 minutes with the patient via telehealth technology discussing the above problems/concerns.    Margaretann Loveless, PA-C

## 2022-05-21 NOTE — Patient Instructions (Signed)
Isaac Laud, thank you for joining Mar Daring, PA-C for today's virtual visit.  While this provider is not your primary care provider (PCP), if your PCP is located in our provider database this encounter information will be shared with them immediately following your visit.   [place] account gives you access to today's visit and all your visits, tests, and labs performed at [facility] " click here if you don't have a [facility] account or go to [email or web address]  Consent: (Patient) Diana Espinoza provided verbal consent for this virtual visit at the beginning of the encounter.  Current Medications:  Current Outpatient Medications:    meclizine (ANTIVERT) 25 MG tablet, Take 1 tablet (25 mg total) by mouth 3 (three) times daily as needed for dizziness., Disp: 30 tablet, Rfl: 0   amoxicillin (AMOXIL) 500 MG capsule, Take 1 capsule (500 mg total) by mouth 3 (three) times daily for 10 days., Disp: 30 capsule, Rfl: 0   escitalopram (LEXAPRO) 10 MG tablet, Take 1 tablet (10 mg total) by mouth daily., Disp: 30 tablet, Rfl: 2   gabapentin (NEURONTIN) 100 MG capsule, Take 1 capsule (100 mg total) by mouth 2 (two) times daily as needed (anxiety/panic attacks)., Disp: 60 capsule, Rfl: 2   ibuprofen (ADVIL) 800 MG tablet, Take 1 tablet (800 mg total) by mouth every 8 (eight) hours as needed., Disp: 30 tablet, Rfl: 0   Medications ordered in this encounter:  Meds ordered this encounter  Medications   meclizine (ANTIVERT) 25 MG tablet    Sig: Take 1 tablet (25 mg total) by mouth 3 (three) times daily as needed for dizziness.    Dispense:  30 tablet    Refill:  0    Order Specific Question:   Supervising Provider    Answer:   Chase Picket [id number]     *If you need refills on other medications prior to your next appointment, please contact your pharmacy*  Follow-Up: Call back or seek an in-person evaluation if the symptoms worsen or if  the condition fails to improve as anticipated.  [place] [phone]  Other Instructions  Benign Positional Vertigo Vertigo is the feeling that you or your surroundings are moving when they are not. Benign positional vertigo is the most common form of vertigo. This is usually a harmless condition (benign). This condition is positional. This means that symptoms are triggered by certain movements and positions. This condition can be dangerous if it occurs while you are doing something that could cause harm to yourself or others. This includes activities such as driving or operating machinery. What are the causes? The inner ear has fluid-filled canals that help your brain sense movement and balance. When the fluid moves, the brain receives messages about your body's position. With benign positional vertigo, calcium crystals in the inner ear break free and disturb the inner ear area. This causes your brain to receive confusing messages about your body's position. What increases the risk? You are more likely to develop this condition if: You are a woman. You are 28 years of age or older. You have recently had a head injury. You have an inner ear disease. What are the signs or symptoms? Symptoms of this condition usually happen when you move your head or your eyes in different directions. Symptoms may start suddenly and usually last for less than a minute. They include: Loss of balance and falling. Feeling like you are spinning or moving. Feeling  like your surroundings are spinning or moving. Nausea and vomiting. Blurred vision. Dizziness. Involuntary eye movement (nystagmus). Symptoms can be mild and cause only minor problems, or they can be severe and interfere with daily life. Episodes of benign positional vertigo may return (recur) over time. Symptoms may also improve over time. How is this diagnosed? This condition may be diagnosed based on: Your medical history. A  physical exam of the head, neck, and ears. Positional tests to check for or stimulate vertigo. You may be asked to turn your head and change positions, such as going from sitting to lying down. A health care provider will watch for symptoms of vertigo. You may be referred to a health care provider who specializes in ear, nose, and throat problems (ENT or otolaryngologist) or a provider who specializes in disorders of the nervous system (neurologist). How is this treated?  This condition may be treated in a session in which your health care provider moves your head in specific positions to help the displaced crystals in your inner ear move. Treatment for this condition may take several sessions. Surgery may be needed in severe cases, but this is rare. In some cases, benign positional vertigo may resolve on its own in 2-4 weeks. Follow these instructions at home: Safety Move slowly. Avoid sudden body or head movements or certain positions, as told by your health care provider. Avoid driving or operating machinery until your health care provider says it is safe. Avoid doing any tasks that would be dangerous to you or others if vertigo occurs. If you have trouble walking or keeping your balance, try using a cane for stability. If you feel dizzy or unstable, sit down right away. Return to your normal activities as told by your health care provider. Ask your health care provider what activities are safe for you. General instructions Take over-the-counter and prescription medicines only as told by your health care provider. Drink enough fluid to keep your urine pale yellow. Keep all follow-up visits. This is important. Contact a health care provider if: You have a fever. Your condition gets worse or you develop new symptoms. Your family or friends notice any behavioral changes. You have nausea or vomiting that gets worse. You have numbness or a prickling and tingling sensation. Get help right away  if you: Have difficulty speaking or moving. Are always dizzy or faint. Develop severe headaches. Have weakness in your legs or arms. Have changes in your hearing or vision. Develop a stiff neck. Develop sensitivity to light. These symptoms may represent a serious problem that is an emergency. Do not wait to see if the symptoms will go away. Get medical help right away. Call your local emergency services (911 in the [place] Do not drive yourself to the hospital. Summary Vertigo is the feeling that you or your surroundings are moving when they are not. Benign positional vertigo is the most common form of vertigo. This condition is caused by calcium crystals in the inner ear that become displaced. This causes a disturbance in an area of the inner ear that helps your brain sense movement and balance. Symptoms include loss of balance and falling, feeling that you or your surroundings are moving, nausea and vomiting, and blurred vision. This condition can be diagnosed based on symptoms, a physical exam, and positional tests. Follow safety instructions as told by your health care provider and keep all follow-up visits. This is important. This information is not intended to replace advice given to you by your health  care provider. Make sure you discuss any questions you have with your health care provider. Document Revised: 04/26/2020 Document Reviewed: 04/26/2020 Elsevier Patient Education  2023 Elsevier Inc.   How to Perform the Epley Maneuver The Epley maneuver is an exercise that relieves symptoms of vertigo. Vertigo is the feeling that you or your surroundings are moving when they are not. When you feel vertigo, you may feel like the room is spinning and may have trouble walking. The Epley maneuver is used for a type of vertigo caused by a calcium deposit in a part of the inner ear. The maneuver involves changing head positions to help the deposit move out of the area. You can do this maneuver at  home whenever you have symptoms of vertigo. You can repeat it in 24 hours if your vertigo has not gone away. Even though the Epley maneuver may relieve your vertigo for a few weeks, it is possible that your symptoms will return. This maneuver relieves vertigo, but it does not relieve dizziness. What are the risks? If it is done correctly, the Epley maneuver is considered safe. Sometimes it can lead to dizziness or nausea that goes away after a short time. If you develop other symptoms--such as changes in vision, weakness, or numbness--stop doing the maneuver and call your health care provider. Supplies needed: A bed or table. A pillow. How to do the Epley maneuver     Sit on the edge of a bed or table with your back straight and your legs extended or hanging over the edge of the bed or table. Turn your head halfway toward the affected ear or side as told by your health care provider. Lie backward quickly with your head turned until you are lying flat on your back. Your head should dangle (head-hanging position). You may want to position a pillow under your shoulders. Hold this position for at least 30 seconds. If you feel dizzy or have symptoms of vertigo, continue to hold the position until the symptoms stop. Turn your head to the opposite direction until your unaffected ear is facing down. Your head should continue to dangle. Hold this position for at least 30 seconds. If you feel dizzy or have symptoms of vertigo, continue to hold the position until the symptoms stop. Turn your whole body to the same side as your head so that you are positioned on your side. Your head will now be nearly facedown and no longer needs to dangle. Hold for at least 30 seconds. If you feel dizzy or have symptoms of vertigo, continue to hold the position until the symptoms stop. Sit back up. You can repeat the maneuver in 24 hours if your vertigo does not go away. Follow these instructions at home: For 24 hours  after doing the Epley maneuver: Keep your head in an upright position. When lying down to sleep or rest, keep your head raised (elevated) with two or more pillows. Avoid excessive neck movements. Activity Do not drive or use machinery if you feel dizzy. After doing the Epley maneuver, return to your normal activities as told by your health care provider. Ask your health care provider what activities are safe for you. General instructions Drink enough fluid to keep your urine pale yellow. Do not drink alcohol. Take over-the-counter and prescription medicines only as told by your health care provider. Keep all follow-up visits. This is important. Preventing vertigo symptoms Ask your health care provider if there is anything you should do at home to prevent  vertigo. He or she may recommend that you: Keep your head elevated with two or more pillows while you sleep. Do not sleep on the side of your affected ear. Get up slowly from bed. Avoid sudden movements during the day. Avoid extreme head positions or movement, such as looking up or bending over. Contact a health care provider if: Your vertigo gets worse. You have other symptoms, including: Nausea. Vomiting. Headache. Get help right away if you: Have vision changes. Have a headache or neck pain that is severe or getting worse. Cannot stop vomiting. Have new numbness or weakness in any part of your body. These symptoms may represent a serious problem that is an emergency. Do not wait to see if the symptoms will go away. Get medical help right away. Call your local emergency services (911 in the [place] Do not drive yourself to the hospital. Summary Vertigo is the feeling that you or your surroundings are moving when they are not. The Epley maneuver is an exercise that relieves symptoms of vertigo. If the Epley maneuver is done correctly, it is considered safe. This information is not intended to replace advice given to you by your  health care provider. Make sure you discuss any questions you have with your health care provider. Document Revised: 04/26/2020 Document Reviewed: 04/26/2020 Elsevier Patient Education  [place] Exercise for Vertigo  The Brandt-Daroff exercise is one of several exercises that can speed up the compensation process and end the symptoms of vertigo. It often is prescribed for people who have benign paroxysmal positional vertigo (BPPV) and sometimes for labyrinthitis. These exercises won't cure these conditions. But over time they can reduce symptoms of vertigo.  People who use this exercise usually are told to do several repetitions of the exercise at least twice a day.  How It Is Done To do the Brandt-Daroff exercise:  Start in an upright, seated position. Move into the lying position on one side with your nose pointed up at about a 45-degree angle. Remain in this position for about 30 seconds (or until the vertigo subsides, whichever is longer), then move back to the seated position. Repeat steps 2 and 3 on the other side. What To Expect Symptoms sometimes suddenly go away during an exercise period. More often, improvement occurs gradually over a period of weeks or months.  Why It Is Done The Brandt-Daroff exercise and other similar exercises are used to treat BPPV. These exercises are sometimes used to treat labyrinthitis or vestibular neuritis.  How Well It Works These exercises can help your body get used to the confusing signals that are causing your vertigo. This may help you get over your vertigo sooner.  The Brandt-Daroff exercise does not help relieve the symptoms of benign paroxysmal positional vertigo (BPPV) as well as the [place] maneuver or the Epley maneuver.footnote1  Risks There are no risks in doing these exercises. To avoid hitting your head or developing minor neck injuries, be careful not to lie down too quickly.  References Citations Fife  TD, et al. (2008). Practice parameter: Therapies for benign paroxysmal positional vertigo (an evidence-based review). Report of the Quality Standards Subcommittee of the [place] [employer]. Neurology, 70(22): [phone]. Current as of: Oct 11, 2020    If you have been instructed to have an in-person evaluation today at a local Urgent Care facility, please use the link below. It will take you to a list of all of our available [facility], including address, phone  number and hours of operation. Please do not delay care.  [facility]  If you or a family member do not have a primary care provider, use the link below to schedule a visit and establish care. When you choose a [facility] primary care physician or advanced practice provider, you gain a long-term partner in health. Find a Primary Care Provider  Learn more about [facility]'s in-office and virtual care options: [facility]

## 2022-05-29 ENCOUNTER — Ambulatory Visit (INDEPENDENT_AMBULATORY_CARE_PROVIDER_SITE_OTHER): Payer: Medicaid Other | Admitting: Radiology

## 2022-05-29 ENCOUNTER — Other Ambulatory Visit (HOSPITAL_COMMUNITY)
Admission: RE | Admit: 2022-05-29 | Discharge: 2022-05-29 | Disposition: A | Discharge status: Home / Self Care | Payer: PRIVATE HEALTH INSURANCE | Source: Ambulatory Visit | Attending: Radiology | Admitting: Radiology

## 2022-05-29 ENCOUNTER — Encounter: Payer: Self-pay | Admitting: Radiology

## 2022-05-29 VITALS — BP 104/66 | Ht 65.0 in | Wt 178.0 lb

## 2022-05-29 DIAGNOSIS — N760 Acute vaginitis: Secondary | ICD-10-CM | POA: Diagnosis not present

## 2022-05-29 DIAGNOSIS — Z01419 Encounter for gynecological examination (general) (routine) without abnormal findings: Secondary | ICD-10-CM | POA: Insufficient documentation

## 2022-05-29 DIAGNOSIS — N912 Amenorrhea, unspecified: Secondary | ICD-10-CM | POA: Diagnosis not present

## 2022-05-29 NOTE — Progress Notes (Signed)
Diana Espinoza April 15, 1981 [id number]   History:  41 y.o. G6P4 presents for annual exam as a new patient. C/o vaginal d/c no itching or odor. Multiple mental health conditions, not currently taking any of her medications. Poor historian. Interested in becoming a surrogate for her sisters pregnancy. Reports a strong history of abnormal paps, unsure if she had any treatment.  Gynecologic History Patient's last menstrual period was 04/22/2022 (exact date). Period Cycle (Days): 28 Period Duration (Days): 4 Period Pattern: Regular Menstrual Flow: Heavy Menstrual Control: Tampon, Maxi pad Dysmenorrhea: (!) Mild Dysmenorrhea Symptoms: Cramping Contraception/Family planning: none Sexually active: not currently Last Pap: ?2020. Results were: abnormal Last mammogram: never.  Obstetric History OB History  Gravida Para Term Preterm AB Living  6 4 4   2 4   SAB IAB Ectopic Multiple Live Births    1 1 0 4    # Outcome Date GA Lbr Len/2nd Weight Sex Delivery Anes PTL Lv  6 Term 09/15/21 [redacted]w[redacted]d  5 lb 10 oz (2.55 kg) F CS-LTranv EPI  LIV  5 Term 2010     Vag-Spont   LIV  4 Term 2005     Vag-Spont   LIV  3 Ectopic 2001          2 Term 1998     Vag-Spont   LIV  1 IAB  [redacted]w[redacted]d            The following portions of the patient's history were reviewed and updated as appropriate: allergies, current medications, past family history, past medical history, past social history, past surgical history, and problem list.  Review of Systems Pertinent items noted in HPI and remainder of comprehensive ROS otherwise negative.   Past medical history, past surgical history, family history and social history were all reviewed and documented in the EPIC chart.   Exam:  Vitals:   05/29/22 0915  BP: 104/66  Weight: 178 lb (80.7 kg)  Height: 5\' 5"  (1.651 m)   Body mass index is 29.62 kg/m.  General appearance:  Normal Thyroid:  Symmetrical, normal in size, without palpable masses or  nodularity. Respiratory  Auscultation:  Clear without wheezing or rhonchi Cardiovascular  Auscultation:  Regular rate, without rubs, murmurs or gallops  Edema/varicosities:  Not grossly evident Abdominal  Soft,nontender, without masses, guarding or rebound.  Liver/spleen:  No organomegaly noted  Hernia:  None appreciated  Skin  Inspection:  Grossly normal Breasts: Examined lying and sitting.   Right: Without masses, retractions, nipple discharge or axillary adenopathy.   Left: Without masses, retractions, nipple discharge or axillary adenopathy. Genitourinary   Inguinal/mons:  Normal without inguinal adenopathy  External genitalia:  Normal appearing vulva with no masses, tenderness, or lesions  BUS/Urethra/Skene's glands:  Normal without masses or exudate  Vagina:  Normal appearing with normal color, no lesions. + white d/c   Cervix:  Normal appearing without discharge or lesions  Uterus:  Normal in size, shape and contour.  Mobile, nontender  Adnexa/parametria:     Rt: Normal in size, without masses or tenderness.   Lt: Normal in size, without masses or tenderness.  Anus and perineum: Normal   Patient informed chaperone available to be present for breast and pelvic exam. Patient has requested no chaperone to be present. Patient has been advised what will be completed during breast and pelvic exam.   Assessment/Plan:   1. Well woman exam with routine gynecological exam - Cytology - PAP( [place]) - Schedule mammogram  2. Acute vaginitis - Cytology -  PAP( [place])  3. Amenorrhea If no menses x 3 mos total will try provera challenge   Will need clearance from psychiatrist before surrogacy.  Discussed SBE, colonoscopy and DEXA screening as directed/appropriate. Recommend of exercise weekly, including weight bearing exercise. Encouraged the use of seatbelts and sunscreen. Return in 1 year for annual or as needed.   Tanda Rockers WHNP-BC 9:34 AM  05/29/2022

## 2022-05-30 ENCOUNTER — Telehealth: Payer: Self-pay | Admitting: *Deleted

## 2022-05-30 NOTE — Telephone Encounter (Signed)
Patient understands she will need clearance from psychiatrist before surrogacy.   Patient has a request for Provider. To remove this portion from Provider note see below:    Multiple mental health conditions, not currently taking any of her medications. Poor historian. Interested in becoming a surrogate for her sisters pregnancy. Reports a strong history of abnormal paps, unsure if she had any treatment.   Routing to Provider

## 2022-05-30 NOTE — Telephone Encounter (Signed)
Unable to remove, all part of her visit history which was relevant to the visit.

## 2022-06-05 ENCOUNTER — Other Ambulatory Visit: Payer: Self-pay

## 2022-06-05 DIAGNOSIS — B379 Candidiasis, unspecified: Secondary | ICD-10-CM

## 2022-06-05 LAB — CYTOLOGY - PAP
Chlamydia: NEGATIVE
Comment: NEGATIVE
Comment: NEGATIVE
Comment: NEGATIVE
Comment: NORMAL
Diagnosis: UNDETERMINED — AB
High risk HPV: NEGATIVE
Neisseria Gonorrhea: NEGATIVE
Trichomonas: NEGATIVE

## 2022-06-05 MED ORDER — FLUCONAZOLE 150 MG PO TABS: 150.0000 mg | ORAL_TABLET | Freq: Once | ORAL | 0 refills | Status: AC

## 2022-06-05 NOTE — Telephone Encounter (Signed)
FYI. Pt notified and voiced understanding.  

## 2022-06-12 ENCOUNTER — Telehealth: Payer: PRIVATE HEALTH INSURANCE | Admitting: Nurse Practitioner

## 2022-06-12 DIAGNOSIS — K047 Periapical abscess without sinus: Secondary | ICD-10-CM | POA: Diagnosis not present

## 2022-06-12 MED ORDER — NAPROXEN 500 MG PO TABS: 500.0000 mg | ORAL_TABLET | Freq: Two times a day (BID) | ORAL | 0 refills | Status: AC

## 2022-06-12 MED ORDER — AMOXICILLIN-POT CLAVULANATE 875-125 MG PO TABS: 1.0000 | ORAL_TABLET | Freq: Two times a day (BID) | ORAL | 0 refills | Status: AC

## 2022-06-12 NOTE — Progress Notes (Signed)
Virtual Visit Consent   Diana Espinoza, you are scheduled for a virtual visit with a [facility] provider today. Just as with appointments in the office, your consent must be obtained to participate. Your consent will be active for this visit and any virtual visit you may have with one of our providers in the next 365 days. If you have a MyChart account, a copy of this consent can be sent to you electronically.  As this is a virtual visit, video technology does not allow for your provider to perform a traditional examination. This may limit your provider's ability to fully assess your condition. If your provider identifies any concerns that need to be evaluated in person or the need to arrange testing (such as labs, EKG, etc.), we will make arrangements to do so. Although advances in technology are sophisticated, we cannot ensure that it will always work on either your end or our end. If the connection with a video visit is poor, the visit may have to be switched to a telephone visit. With either a video or telephone visit, we are not always able to ensure that we have a secure connection.  By engaging in this virtual visit, you consent to the provision of healthcare and authorize for your insurance to be billed (if applicable) for the services provided during this visit. Depending on your insurance coverage, you may receive a charge related to this service.  I need to obtain your verbal consent now. Are you willing to proceed with your visit today? Diana Espinoza has provided verbal consent on 06/12/2022 for a virtual visit (video or telephone). Apolonio Schneiders, FNP  Date: 06/12/2022 10:14 AM  Virtual Visit via Video Note   I, Apolonio Schneiders, connected with  Diana Espinoza  ([id number], [birth year]) on 06/12/22 at 10:15 AM EST by a video-enabled telemedicine application and verified that I am speaking with the correct person using two identifiers.  Location: Patient: Virtual Visit Location Patient:  Home Provider: Virtual Visit Location Provider: Home Office   I discussed the limitations of evaluation and management by telemedicine and the availability of in person appointments. The patient expressed understanding and agreed to proceed.    History of Present Illness: Diana Espinoza is a 42 y.o. who identifies as a female who was assigned female at birth, and is being seen today for dental pain.  Last night she started to have tooth pain and soreness.  She was seen three weeks ago for follow up after treatment   She has not had her wisdom teeth removed. Pain is in molar just in front of her wisdom tooth on back right bottom tooth.  She is on a cancellation list with the dentist to be seen ASAP   She has been using 800mg  ibuprofen for pain relief.  Patient Active Problem List   Diagnosis Date Noted   Severe major depression, single episode ([place]) 04/10/2022   PTSD (post-traumatic stress disorder) 04/10/2022   Alcohol use disorder, mild, abuse 04/10/2022   Advanced maternal age in multigravida, third trimester 09/14/2021   MDD (major depressive disorder), recurrent episode ([facility] [place]) 12/07/2018   Pancreatitis     Allergies:  Allergies  Allergen Reactions   Avocado Other (See Comments)    Pancreatitis   Medications: No current outpatient medications on file.  Observations/Objective: Patient is well-developed, well-nourished in no acute distress.  Resting comfortably  at home.  Head is normocephalic, atraumatic.  No labored breathing.  Speech is clear and coherent with logical  content.  Patient is alert and oriented at baseline.    Assessment and Plan: 1. Dental infection  - amoxicillin-clavulanate (AUGMENTIN) 875-125 MG tablet; Take 1 tablet by mouth 2 (two) times daily for 7 days. Take with food  Dispense: 14 tablet; Refill: 0 - naproxen (NAPROSYN) 500 MG tablet; Take 1 tablet (500 mg total) by mouth 2 (two) times daily with a meal for 7 days.  Dispense: 14 tablet; Refill:  0    Follow Up Instructions: I discussed the assessment and treatment plan with the patient. The patient was provided an opportunity to ask questions and all were answered. The patient agreed with the plan and demonstrated an understanding of the instructions.  A copy of instructions were sent to the patient via MyChart unless otherwise noted below.    The patient was advised to call back or seek an in-person evaluation if the symptoms worsen or if the condition fails to improve as anticipated.  Time:  I spent 10 minutes with the patient via telehealth technology discussing the above problems/concerns.    Apolonio Schneiders, FNP

## 2022-06-13 ENCOUNTER — Other Ambulatory Visit: Payer: Self-pay | Admitting: Physician Assistant

## 2022-06-13 DIAGNOSIS — Z1231 Encounter for screening mammogram for malignant neoplasm of breast: Secondary | ICD-10-CM

## 2022-06-21 ENCOUNTER — Ambulatory Visit (INDEPENDENT_AMBULATORY_CARE_PROVIDER_SITE_OTHER): Payer: No Typology Code available for payment source | Admitting: Mental Health

## 2022-06-21 DIAGNOSIS — F1010 Alcohol abuse, uncomplicated: Secondary | ICD-10-CM

## 2022-06-21 DIAGNOSIS — F4310 Post-traumatic stress disorder, unspecified: Secondary | ICD-10-CM

## 2022-06-21 DIAGNOSIS — F322 Major depressive disorder, single episode, severe without psychotic features: Secondary | ICD-10-CM | POA: Diagnosis not present

## 2022-06-24 ENCOUNTER — Ambulatory Visit: Payer: No Typology Code available for payment source | Admitting: Family Medicine
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
## 2022-06-28 ENCOUNTER — Encounter (HOSPITAL_COMMUNITY)
Payer: No Typology Code available for payment source | Admitting: Student in an Organized Health Care Education/Training Program

## 2022-07-16 ENCOUNTER — Ambulatory Visit (INDEPENDENT_AMBULATORY_CARE_PROVIDER_SITE_OTHER): Payer: No Typology Code available for payment source | Admitting: Mental Health

## 2022-07-16 DIAGNOSIS — F1010 Alcohol abuse, uncomplicated: Secondary | ICD-10-CM

## 2022-07-16 DIAGNOSIS — F322 Major depressive disorder, single episode, severe without psychotic features: Secondary | ICD-10-CM | POA: Diagnosis not present

## 2022-07-16 DIAGNOSIS — F4310 Post-traumatic stress disorder, unspecified: Secondary | ICD-10-CM

## 2022-07-16 DIAGNOSIS — F1011 Alcohol abuse, in remission: Secondary | ICD-10-CM
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
## 2022-08-01 DIAGNOSIS — Z1231 Encounter for screening mammogram for malignant neoplasm of breast: Secondary | ICD-10-CM

## 2022-08-13 ENCOUNTER — Encounter (HOSPITAL_COMMUNITY): Payer: Self-pay | Admitting: Emergency Medicine

## 2022-08-13 ENCOUNTER — Other Ambulatory Visit: Payer: Self-pay

## 2022-08-13 ENCOUNTER — Emergency Department (HOSPITAL_COMMUNITY)
Admission: EM | Admit: 2022-08-13 | Discharge: 2022-08-13 | Disposition: A | Discharge status: Home / Self Care | Payer: No Typology Code available for payment source | Attending: Emergency Medicine | Admitting: Emergency Medicine

## 2022-08-13 DIAGNOSIS — F10230 Alcohol dependence with withdrawal, uncomplicated: Secondary | ICD-10-CM | POA: Insufficient documentation

## 2022-08-13 DIAGNOSIS — Y909 Presence of alcohol in blood, level not specified: Secondary | ICD-10-CM | POA: Insufficient documentation

## 2022-08-13 DIAGNOSIS — F10930 Alcohol use, unspecified with withdrawal, uncomplicated: Secondary | ICD-10-CM

## 2022-08-13 DIAGNOSIS — R112 Nausea with vomiting, unspecified: Secondary | ICD-10-CM | POA: Diagnosis present

## 2022-08-13 LAB — CBC WITH DIFFERENTIAL/PLATELET
Abs Immature Granulocytes: 0.01 10*3/uL (ref 0.00–0.07)
Basophils Absolute: 0 10*3/uL (ref 0.0–0.1)
Basophils Relative: 1 %
Eosinophils Absolute: 0 10*3/uL (ref 0.0–0.5)
Eosinophils Relative: 0 %
HCT: 39.7 % (ref 36.0–46.0)
Hemoglobin: 12.7 g/dL (ref 12.0–15.0)
Immature Granulocytes: 0 %
Lymphocytes Relative: 31 %
Lymphs Abs: 1.5 10*3/uL (ref 0.7–4.0)
MCH: 29.5 pg (ref 26.0–34.0)
MCHC: 32 g/dL (ref 30.0–36.0)
MCV: 92.1 fL (ref 80.0–100.0)
Monocytes Absolute: 0.3 10*3/uL (ref 0.1–1.0)
Monocytes Relative: 5 %
Neutro Abs: 3 10*3/uL (ref 1.7–7.7)
Neutrophils Relative %: 63 %
Platelets: 315 10*3/uL (ref 150–400)
RBC: 4.31 MIL/uL (ref 3.87–5.11)
RDW: 15 % (ref 11.5–15.5)
WBC: 4.8 10*3/uL (ref 4.0–10.5)
nRBC: 0 % (ref 0.0–0.2)

## 2022-08-13 LAB — COMPREHENSIVE METABOLIC PANEL
ALT: 31 U/L (ref 0–44)
AST: 38 U/L (ref 15–41)
Albumin: 4.4 g/dL (ref 3.5–5.0)
Alkaline Phosphatase: 76 U/L (ref 38–126)
Anion gap: 12 (ref 5–15)
BUN: 8 mg/dL (ref 6–20)
CO2: 23 mmol/L (ref 22–32)
Calcium: 9.1 mg/dL (ref 8.9–10.3)
Chloride: 101 mmol/L (ref 98–111)
Creatinine, Ser: 0.7 mg/dL (ref 0.44–1.00)
GFR, Estimated: >60 mL/min (ref >60)
Glucose, Bld: 92 mg/dL (ref 70–99)
Potassium: 3.4 mmol/L — ABNORMAL LOW (ref 3.5–5.1)
Sodium: 136 mmol/L (ref 135–145)
Total Bilirubin: 1 mg/dL (ref 0.3–1.2)
Total Protein: 8.5 g/dL — ABNORMAL HIGH (ref 6.5–8.1)

## 2022-08-13 LAB — LIPASE, BLOOD: Lipase: 32 U/L (ref 11–51)

## 2022-08-13 LAB — ETHANOL: Alcohol, Ethyl (B): <10 mg/dL (ref <10)

## 2022-08-13 MED ORDER — CHLORDIAZEPOXIDE HCL 25 MG PO CAPS: ORAL_CAPSULE | ORAL | 0 refills | Status: AC

## 2022-08-13 MED ORDER — KETOROLAC TROMETHAMINE 15 MG/ML IJ SOLN
15.0000 mg | Freq: Once | INTRAMUSCULAR | Status: AC
  Administered 2022-08-13: 15 mg via INTRAVENOUS
  Filled 2022-08-13: qty 1

## 2022-08-13 MED ORDER — LORAZEPAM 1 MG PO TABS: 1.0000 mg | ORAL_TABLET | Freq: Two times a day (BID) | ORAL | Status: DC

## 2022-08-13 MED ORDER — THIAMINE HCL 100 MG/ML IJ SOLN: 100.0000 mg | Freq: Every day | INTRAMUSCULAR | Status: DC

## 2022-08-13 MED ORDER — LORAZEPAM 1 MG PO TABS
0.0000 mg | ORAL_TABLET | Freq: Four times a day (QID) | ORAL | Status: DC
  Administered 2022-08-13: 2 mg via ORAL

## 2022-08-13 MED ORDER — LORAZEPAM 1 MG PO TABS: 0.0000 mg | ORAL_TABLET | Freq: Two times a day (BID) | ORAL | Status: DC

## 2022-08-13 MED ORDER — LACTATED RINGERS IV BOLUS
1000.0000 mL | Freq: Once | INTRAVENOUS | Status: AC
  Administered 2022-08-13: 1000 mL via INTRAVENOUS

## 2022-08-13 MED ORDER — ONDANSETRON HCL 4 MG/2ML IJ SOLN
4.0000 mg | Freq: Once | INTRAMUSCULAR | Status: AC
  Administered 2022-08-13: 4 mg via INTRAVENOUS
  Filled 2022-08-13: qty 2

## 2022-08-13 MED ORDER — LORAZEPAM 1 MG PO TABS
2.0000 mg | ORAL_TABLET | Freq: Four times a day (QID) | ORAL | Status: DC
  Filled 2022-08-13: qty 2

## 2022-08-13 MED ORDER — THIAMINE MONONITRATE 100 MG PO TABS
100.0000 mg | ORAL_TABLET | Freq: Every day | ORAL | Status: DC
  Administered 2022-08-13: 100 mg via ORAL
  Filled 2022-08-13: qty 1

## 2022-08-13 NOTE — ED Triage Notes (Signed)
Patient arrives ambulatory by POV concerned she is dehydrated. Patient reports a drinking binge for 3 days straight with last drink last night. Patient has obvious tremor. Reports N/V.

## 2022-08-13 NOTE — ED Provider Notes (Signed)
[place] Provider Note   CSN: [id number] Arrival date & time: 08/13/22  1508     History  Chief Complaint  Patient presents with   Emesis    Diana Espinoza is a 42 y.o. female with a past medical history of depression, PTSD, with nausea, vomiting with emesis.  Reports going on a 3-day alcohol binge.  She says that she Drinks a bottle of wine every day.  No other drugs.  She says that she would like to quit drinking at this time and that she has tried before unsuccessfully.  She does note that she was sober up until 3 days ago since October 2023.   Emesis      Home Medications Prior to Admission medications   Not on File      Allergies    Avocado    Review of Systems   Review of Systems  Gastrointestinal:  Positive for vomiting.    Physical Exam Updated Vital Signs BP (!) 144/88 (BP Location: Right Arm)   Pulse (!) 115   Temp 98.2 F (36.8 C) (Oral)   Resp 20   Ht '5\' 5"'$  (1.651 m)   Wt 79.4 kg   LMP 08/04/2022   SpO2 100%   BMI 29.12 kg/m  Physical Exam Vitals and nursing note reviewed.  Constitutional:      General: She is not in acute distress.    Appearance: Normal appearance. She is not ill-appearing.  HENT:     Head: Normocephalic and atraumatic.     Mouth/Throat:     Mouth: Mucous membranes are moist.     Pharynx: Oropharynx is clear.  Eyes:     General: No scleral icterus.    Conjunctiva/sclera: Conjunctivae normal.  Cardiovascular:     Rate and Rhythm: Normal rate and regular rhythm.  Pulmonary:     Effort: Pulmonary effort is normal. No respiratory distress.     Breath sounds: No wheezing or rales.  Abdominal:     General: Abdomen is flat.     Palpations: Abdomen is soft.     Tenderness: There is no abdominal tenderness.  Skin:    General: Skin is warm and dry.     Findings: No rash.  Neurological:     Mental Status: She is alert and oriented to person, place, and time.  Psychiatric:         Mood and Affect: Mood normal.     ED Results / Procedures / Treatments   Labs (all labs ordered are listed, but only abnormal results are displayed) Labs Reviewed  COMPREHENSIVE METABOLIC PANEL - Abnormal; Notable for the following components:      Result Value   Potassium 3.4 (*)    Total Protein 8.5 (*)    All other components within normal limits  CBC WITH DIFFERENTIAL/PLATELET  ETHANOL  LIPASE, BLOOD  PREGNANCY, URINE  URINALYSIS, ROUTINE W REFLEX MICROSCOPIC    EKG None  Radiology No results found.  Procedures Procedures   Medications Ordered in ED Medications  LORazepam (ATIVAN) tablet 2 mg (has no administration in time range)    Or  LORazepam (ATIVAN) tablet 0-4 mg (has no administration in time range)  LORazepam (ATIVAN) tablet 1 mg (has no administration in time range)    Or  LORazepam (ATIVAN) tablet 0-4 mg (has no administration in time range)  thiamine (VITAMIN B1) tablet 100 mg (has no administration in time range)    Or  thiamine (VITAMIN B1)  injection 100 mg (has no administration in time range)  lactated ringers bolus 1,000 mL (has no administration in time range)    ED Course/ Medical Decision Making/ A&P Clinical Course as of 08/13/22 1831  Tue Aug 13, 2022  1807 Patient is well-appearing.  No longer tremoring.  Ambulating in the room and on her phone. [MR]    Clinical Course User Index [MR] Joshuah Minella, Cecilio Asper, PA-C                             Medical Decision Making Amount and/or Complexity of Data Reviewed Labs: ordered.  Risk OTC drugs. Prescription drug management.     Past Medical History / Co-morbidities / Social History: MDD, PTSD, alcohol use disorder   Additional history: Per chart review patient has had multiple visits for alcohol withdrawal symptoms over the past couple of years.  Follows with the behavioral health team for depression, PTSD and alcohol use disorder.   Physical Exam: Pertinent physical exam  findings include Slightly tremulous on original exam however this resolved after medications  Lab Tests: I ordered, and personally interpreted labs.  The pertinent results include: Benign    Medications: Ativan for alcohol withdrawals Toradol for headache Zofran for nausea, fluids for hydration   MDM/Disposition: This is a 42 year old female who presented today with concern of alcohol withdrawals.  Reports that she drinks at least a bottle of wine every day for the past couple of days and last drink was overnight.  Endorses some anxiety and tremors.  Also some vomiting.  Lab work relatively benign.  She was given fluids, Toradol for headache, Zofran for nausea and Ativan for withdrawal symptoms.  She reported feeling better.  Considered admission for alcohol withdrawals however patient has a low CIWA and no signs of alcohol withdrawal complications.  She would like to quit drinking and she has been started on a Librium taper as well as given resources for outpatient detox facilities and behavioral health urgent care.  She is thankful for her care today and had no further questions on discharge.  Proper use of Librium was discussed and attached to her papers.   Final Clinical Impression(s) / ED Diagnoses Final diagnoses:  Alcohol withdrawal syndrome without complication ([place])    Rx / DC Orders ED Discharge Orders          Ordered    chlordiazePOXIDE (LIBRIUM) 25 MG capsule        08/13/22 1813           Results and diagnoses were explained to the patient. Return precautions discussed in full. Patient had no additional questions and expressed complete understanding.   This chart was dictated using voice recognition software.  Despite best efforts to proofread,  errors can occur which can change the documentation meaning.    Darliss Ridgel 08/13/22 Dortha Schwalbe, MD 08/15/22 [phone]

## 2022-08-13 NOTE — Discharge Instructions (Addendum)
You came to the emergency department with concern for alcohol withdrawals.  You were treated with fluids and Ativan.  I am starting you on Librium.  This is a benzodiazepine similar to Ativan.  It is important that you do not drink any alcohol with this as it may make you sick or even further drowsy.  Please follow-up with the behavioral health urgent care with any emergent concerns for your alcohol use disorder.  Otherwise, you may use the resources attached to these discharge papers to try and find a detox program or facility.  It was a pleasure to meet you and we hope you feel better.

## 2022-08-20 ENCOUNTER — Ambulatory Visit (INDEPENDENT_AMBULATORY_CARE_PROVIDER_SITE_OTHER): Payer: No Typology Code available for payment source | Admitting: Mental Health

## 2022-08-20 DIAGNOSIS — F4310 Post-traumatic stress disorder, unspecified: Secondary | ICD-10-CM

## 2022-08-20 DIAGNOSIS — F1010 Alcohol abuse, uncomplicated: Secondary | ICD-10-CM

## 2022-08-20 DIAGNOSIS — F322 Major depressive disorder, single episode, severe without psychotic features: Secondary | ICD-10-CM | POA: Diagnosis not present
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
## 2022-08-25 ENCOUNTER — Other Ambulatory Visit: Payer: Self-pay

## 2022-08-25 ENCOUNTER — Encounter (HOSPITAL_COMMUNITY): Payer: Self-pay

## 2022-08-25 ENCOUNTER — Emergency Department (HOSPITAL_COMMUNITY)
Admission: EM | Admit: 2022-08-25 | Discharge: 2022-08-25 | Disposition: A | Discharge status: Home / Self Care | Payer: No Typology Code available for payment source | Attending: Emergency Medicine | Admitting: Emergency Medicine

## 2022-08-25 DIAGNOSIS — F1020 Alcohol dependence, uncomplicated: Secondary | ICD-10-CM | POA: Diagnosis not present

## 2022-08-25 DIAGNOSIS — E876 Hypokalemia: Secondary | ICD-10-CM | POA: Insufficient documentation

## 2022-08-25 DIAGNOSIS — R112 Nausea with vomiting, unspecified: Secondary | ICD-10-CM

## 2022-08-25 DIAGNOSIS — R197 Diarrhea, unspecified: Secondary | ICD-10-CM | POA: Insufficient documentation

## 2022-08-25 LAB — CBC WITH DIFFERENTIAL/PLATELET
Abs Immature Granulocytes: 0 10*3/uL (ref 0.00–0.07)
Basophils Absolute: 0 10*3/uL (ref 0.0–0.1)
Basophils Relative: 1 %
Eosinophils Absolute: 0 10*3/uL (ref 0.0–0.5)
Eosinophils Relative: 1 %
HCT: 37.2 % (ref 36.0–46.0)
Hemoglobin: 11.9 g/dL — ABNORMAL LOW (ref 12.0–15.0)
Immature Granulocytes: 0 %
Lymphocytes Relative: 52 %
Lymphs Abs: 1.9 10*3/uL (ref 0.7–4.0)
MCH: 29 pg (ref 26.0–34.0)
MCHC: 32 g/dL (ref 30.0–36.0)
MCV: 90.7 fL (ref 80.0–100.0)
Monocytes Absolute: 0.5 10*3/uL (ref 0.1–1.0)
Monocytes Relative: 13 %
Neutro Abs: 1.3 10*3/uL — ABNORMAL LOW (ref 1.7–7.7)
Neutrophils Relative %: 33 %
Platelets: 296 10*3/uL (ref 150–400)
RBC: 4.1 MIL/uL (ref 3.87–5.11)
RDW: 15.2 % (ref 11.5–15.5)
WBC: 3.7 10*3/uL — ABNORMAL LOW (ref 4.0–10.5)
nRBC: 0 % (ref 0.0–0.2)

## 2022-08-25 LAB — COMPREHENSIVE METABOLIC PANEL
ALT: 39 U/L (ref 0–44)
AST: 42 U/L — ABNORMAL HIGH (ref 15–41)
Albumin: 4 g/dL (ref 3.5–5.0)
Alkaline Phosphatase: 73 U/L (ref 38–126)
Anion gap: 10 (ref 5–15)
BUN: 6 mg/dL (ref 6–20)
CO2: 19 mmol/L — ABNORMAL LOW (ref 22–32)
Calcium: 8.4 mg/dL — ABNORMAL LOW (ref 8.9–10.3)
Chloride: 104 mmol/L (ref 98–111)
Creatinine, Ser: 0.71 mg/dL (ref 0.44–1.00)
GFR, Estimated: >60 mL/min (ref >60)
Glucose, Bld: 94 mg/dL (ref 70–99)
Potassium: 3 mmol/L — ABNORMAL LOW (ref 3.5–5.1)
Sodium: 133 mmol/L — ABNORMAL LOW (ref 135–145)
Total Bilirubin: 0.7 mg/dL (ref 0.3–1.2)
Total Protein: 8 g/dL (ref 6.5–8.1)

## 2022-08-25 LAB — ETHANOL: Alcohol, Ethyl (B): 32 mg/dL — ABNORMAL HIGH (ref <10)

## 2022-08-25 LAB — I-STAT BETA HCG BLOOD, ED (MC, WL, AP ONLY): I-stat hCG, quantitative: <5 m[IU]/mL (ref <5)

## 2022-08-25 LAB — MAGNESIUM: Magnesium: 1.8 mg/dL (ref 1.7–2.4)

## 2022-08-25 MED ORDER — DROPERIDOL 2.5 MG/ML IJ SOLN
1.2500 mg | Freq: Once | INTRAMUSCULAR | Status: AC
  Administered 2022-08-25: 1.25 mg via INTRAVENOUS
  Filled 2022-08-25: qty 2

## 2022-08-25 MED ORDER — ONDANSETRON 8 MG PO TBDP: ORAL_TABLET | ORAL | 0 refills | Status: AC

## 2022-08-25 MED ORDER — THIAMINE HCL 100 MG/ML IJ SOLN
100.0000 mg | Freq: Once | INTRAMUSCULAR | Status: AC
  Administered 2022-08-25: 100 mg via INTRAVENOUS
  Filled 2022-08-25: qty 2

## 2022-08-25 MED ORDER — SODIUM CHLORIDE 0.9 % IV BOLUS
500.0000 mL | Freq: Once | INTRAVENOUS | Status: AC
  Administered 2022-08-25: 500 mL via INTRAVENOUS

## 2022-08-25 MED ORDER — POTASSIUM CHLORIDE CRYS ER 20 MEQ PO TBCR
40.0000 meq | EXTENDED_RELEASE_TABLET | Freq: Once | ORAL | Status: AC
  Administered 2022-08-25: 40 meq via ORAL
  Filled 2022-08-25: qty 2

## 2022-08-25 MED ORDER — CARBAMAZEPINE 200 MG PO TABS: ORAL_TABLET | ORAL | 0 refills | Status: DC

## 2022-08-25 MED ORDER — MAGNESIUM SULFATE 2 GM/50ML IV SOLN
2.0000 g | Freq: Once | INTRAVENOUS | Status: AC
  Administered 2022-08-25: 2 g via INTRAVENOUS
  Filled 2022-08-25: qty 50

## 2022-08-25 MED ORDER — KETOROLAC TROMETHAMINE 30 MG/ML IJ SOLN
30.0000 mg | Freq: Once | INTRAMUSCULAR | Status: AC
  Administered 2022-08-25: 30 mg via INTRAVENOUS
  Filled 2022-08-25: qty 1

## 2022-08-25 NOTE — ED Triage Notes (Signed)
Patient reports she is withdrawing from alcohol. States her last drink was two days ago and she normally drinks a bottle of wine daily. Patient vomiting during triage, endorses vomiting all day.

## 2022-08-25 NOTE — ED Provider Notes (Signed)
[facility] DEPARTMENT AT [facility] Provider Note   CSN: [id number] Arrival date & time: 08/25/22  0131     History  Chief Complaint  Patient presents with   Alcohol Problem    Diana Espinoza is a 42 y.o. female.  The history is provided by the patient.  Alcohol Problem This is a chronic problem. The current episode started more than 1 week ago. The problem occurs constantly. Nothing aggravates the symptoms. Nothing relieves the symptoms. The treatment provided no relief.  Patient who reports drinking 1-2 bottles of wine a day and last week drank a bottle of vodka stopped drinking a little more than 24 hours presents with feeling shaky and also nausea vomiting and diarrhea.       Home Medications Prior to Admission medications   Medication Sig Start Date End Date Taking? Authorizing Provider  carbamazepine (TEGRETOL) 200 MG tablet 800mg  PO QD X 1D, then 600mg  PO QD X 1D, then 400mg  QD X 1D, then 200mg  PO QD X 2D 08/25/22  Yes Korrin Waterfield, MD  chlordiazePOXIDE (LIBRIUM) 25 MG capsule 50mg  PO TID x 1D, then 25-50mg  PO BID X 1D, then 25-50mg  PO QD X 1D 08/13/22   Redwine, Madison A, PA-C      Allergies    Avocado    Review of Systems   Review of Systems  Constitutional:  Negative for fever.  HENT:  Negative for facial swelling.   Eyes:  Negative for redness.  Gastrointestinal:  Positive for diarrhea, nausea and vomiting.  Psychiatric/Behavioral:  Negative for suicidal ideas.   All other systems reviewed and are negative.   Physical Exam Updated Vital Signs BP (!) 153/103 (BP Location: Left Arm)   Pulse (!) 113   Temp 98 F (36.7 C) (Oral)   Resp 20   LMP 08/04/2022   SpO2 99%  Physical Exam Vitals and nursing note reviewed. Exam conducted with a chaperone present.  Constitutional:      General: She is not in acute distress.    Appearance: Normal appearance. She is well-developed.  HENT:     Head: Normocephalic and atraumatic.     Nose:  Nose normal.  Eyes:     Pupils: Pupils are equal, round, and reactive to light.  Cardiovascular:     Rate and Rhythm: Normal rate and regular rhythm.     Pulses: Normal pulses.     Heart sounds: Normal heart sounds.  Pulmonary:     Effort: Pulmonary effort is normal. No respiratory distress.     Breath sounds: Normal breath sounds.  Abdominal:     General: Bowel sounds are normal. There is no distension.     Palpations: Abdomen is soft.     Tenderness: There is no abdominal tenderness. There is no guarding or rebound.  Genitourinary:    Vagina: No vaginal discharge.  Musculoskeletal:        General: Normal range of motion.     Cervical back: Neck supple.  Skin:    General: Skin is warm and dry.     Capillary Refill: Capillary refill takes less than 2 seconds.     Findings: No erythema or rash.  Neurological:     General: No focal deficit present.     Mental Status: She is alert.     Deep Tendon Reflexes: Reflexes normal.  Psychiatric:        Mood and Affect: Mood normal.        Thought Content: Thought content normal.  ED Results / Procedures / Treatments   Labs (all labs ordered are listed, but only abnormal results are displayed) Results for orders placed or performed during the hospital encounter of 08/25/22  CBC with Differential  Result Value Ref Range   WBC 3.7 (L) 4.0 - 10.5 K/uL   RBC 4.10 3.87 - 5.11 MIL/uL   Hemoglobin 11.9 (L) 12.0 - 15.0 g/dL   HCT 37.2 36.0 - 46.0 %   MCV 90.7 80.0 - 100.0 fL   MCH 29.0 26.0 - 34.0 pg   MCHC 32.0 30.0 - 36.0 g/dL   RDW 15.2 11.5 - 15.5 %   Platelets 296 150 - 400 K/uL   nRBC 0.0 0.0 - 0.2 %   Neutrophils Relative % 33 %   Neutro Abs 1.3 (L) 1.7 - 7.7 K/uL   Lymphocytes Relative 52 %   Lymphs Abs 1.9 0.7 - 4.0 K/uL   Monocytes Relative 13 %   Monocytes Absolute 0.5 0.1 - 1.0 K/uL   Eosinophils Relative 1 %   Eosinophils Absolute 0.0 0.0 - 0.5 K/uL   Basophils Relative 1 %   Basophils Absolute 0.0 0.0 - 0.1 K/uL    Immature Granulocytes 0 %   Abs Immature Granulocytes 0.00 0.00 - 0.07 K/uL  Comprehensive metabolic panel  Result Value Ref Range   Sodium 133 (L) 135 - 145 mmol/L   Potassium 3.0 (L) 3.5 - 5.1 mmol/L   Chloride 104 98 - 111 mmol/L   CO2 19 (L) 22 - 32 mmol/L   Glucose, Bld 94 70 - 99 mg/dL   BUN 6 6 - 20 mg/dL   Creatinine, Ser 0.71 0.44 - 1.00 mg/dL   Calcium 8.4 (L) 8.9 - 10.3 mg/dL   Total Protein 8.0 6.5 - 8.1 g/dL   Albumin 4.0 3.5 - 5.0 g/dL   AST 42 (H) 15 - 41 U/L   ALT 39 0 - 44 U/L   Alkaline Phosphatase 73 38 - 126 U/L   Total Bilirubin 0.7 0.3 - 1.2 mg/dL   GFR, Estimated >60 >60 mL/min   Anion gap 10 5 - 15  Ethanol  Result Value Ref Range   Alcohol, Ethyl (B) 32 (H) <10 mg/dL  Magnesium  Result Value Ref Range   Magnesium 1.8 1.7 - 2.4 mg/dL  I-Stat Beta hCG blood, ED ([facility], [facility], [facility] only)  Result Value Ref Range   I-stat hCG, quantitative <5.0 <5 mIU/mL   Comment 3           No results found.   EKG  EKG Interpretation  Date/Time:  Sunday August 25 2022 01:50:24 EDT Ventricular Rate:  94 PR Interval:  151 QRS Duration: 125 QT Interval:  388 QTC Calculation: 483 R Axis:   33 Text Interpretation: Sinus rhythm Nonspecific intraventricular conduction delay Confirmed by Efrata Brunner ([id number]) on 08/25/2022 3:01:24 AM         Radiology No results found.  Procedures Procedures    Medications Ordered in ED Medications  thiamine (VITAMIN B1) injection 100 mg (100 mg Intravenous Given 08/25/22 0208)  sodium chloride 0.9 % bolus 500 mL (500 mLs Intravenous New Bag/Given 08/25/22 0203)  droperidol (INAPSINE) 2.5 MG/ML injection 1.25 mg (1.25 mg Intravenous Given 08/25/22 0206)    ED Course/ Medical Decision Making/ A&P                             Medical Decision Making Patient who drinks 1-2 bottles of  wine a day stopped drinking Friday into Saturday.  Also has nausea vomiting and diarrhea   Problems Addressed: Alcoholism ([place]): chronic illness  or injury Hypokalemia:    Details: Potassium replenished along with magnesium in the ED Nausea vomiting and diarrhea: acute illness or injury    Details: Zofran RX given.  No further episodes in the ED.  Tolerating PO fluids.    Amount and/or Complexity of Data Reviewed External Data Reviewed: notes.    Details: Previous notes reviewed  Labs: ordered.    Details: All labs reviewed:  pregnancy is negative.  Alcohol is 32.  Magnesium 1.8.  Sodium 133, potassium low 3.0, normal creatinine .71, elevated AST 42, normal ALT and normal bilirubin.  White count slight low 3.7, low hemoglobin 11.9, normal platelet   Risk Prescription drug management. Risk Details: No further emesis    Final Clinical Impression(s) / ED Diagnoses Final diagnoses:  Nausea vomiting and diarrhea   Return for intractable cough, coughing up blood, fevers > 100.4 unrelieved by medication, shortness of breath, intractable vomiting, chest pain, shortness of breath, weakness, numbness, changes in speech, facial asymmetry, abdominal pain, passing out, Inability to tolerate liquids or food, cough, altered mental status or any concerns. No signs of systemic illness or infection. The patient is nontoxic-appearing on exam and vital signs are within normal limits.  I have reviewed the triage vital signs and the nursing notes. Pertinent labs & imaging results that were available during my care of the patient were reviewed by me and considered in my medical decision making (see chart for details). After history, exam, and medical workup I feel the patient has been appropriately medically screened and is safe for discharge home. Pertinent diagnoses were discussed with the patient. Patient was given return precautions. Rx / DC Orders Zofran ODT   Harvest Deist, MD 08/25/22 [phone]

## 2022-09-11 ENCOUNTER — Telehealth: Payer: No Typology Code available for payment source | Admitting: Physician Assistant

## 2022-09-11 DIAGNOSIS — B9689 Other specified bacterial agents as the cause of diseases classified elsewhere: Secondary | ICD-10-CM

## 2022-09-11 DIAGNOSIS — N760 Acute vaginitis: Secondary | ICD-10-CM

## 2022-09-11 MED ORDER — METRONIDAZOLE 500 MG PO TABS: 500.0000 mg | ORAL_TABLET | Freq: Two times a day (BID) | ORAL | 0 refills | Status: AC

## 2022-09-11 NOTE — Progress Notes (Signed)
Virtual Visit Consent   Diana Espinoza, you are scheduled for a virtual visit with a [facility] provider today. Just as with appointments in the office, your consent must be obtained to participate. Your consent will be active for this visit and any virtual visit you may have with one of our providers in the next 365 days. If you have a MyChart account, a copy of this consent can be sent to you electronically.  As this is a virtual visit, video technology does not allow for your provider to perform a traditional examination. This may limit your provider's ability to fully assess your condition. If your provider identifies any concerns that need to be evaluated in person or the need to arrange testing (such as labs, EKG, etc.), we will make arrangements to do so. Although advances in technology are sophisticated, we cannot ensure that it will always work on either your end or our end. If the connection with a video visit is poor, the visit may have to be switched to a telephone visit. With either a video or telephone visit, we are not always able to ensure that we have a secure connection.  By engaging in this virtual visit, you consent to the provision of healthcare and authorize for your insurance to be billed (if applicable) for the services provided during this visit. Depending on your insurance coverage, you may receive a charge related to this service.  I need to obtain your verbal consent now. Are you willing to proceed with your visit today? Diana Espinoza has provided verbal consent on 09/11/2022 for a virtual visit (video or telephone). Mar Daring, PA-C  Date: 09/11/2022 7:19 PM  Virtual Visit via Video Note   I, Mar Daring, connected with  Diana Espinoza  ([id number], 1981-04-28) on 09/11/22 at  7:00 PM EDT by a video-enabled telemedicine application and verified that I am speaking with the correct person using two identifiers.  Location: Patient: Virtual Visit Location  Patient: Mobile Provider: Virtual Visit Location Provider: Home Office   I discussed the limitations of evaluation and management by telemedicine and the availability of in person appointments. The patient expressed understanding and agreed to proceed.    History of Present Illness: Diana Espinoza is a 42 y.o. who identifies as a female who was assigned female at birth, and is being seen today for vaginal discharge.  HPI: Vaginal Discharge The patient's primary symptoms include a genital odor and vaginal discharge. The patient's pertinent negatives include no genital itching. This is a recurrent problem. The current episode started 1 to 4 weeks ago (1.5 weeks ago). The problem occurs constantly. The problem has been gradually worsening. The pain is mild. She is not pregnant. Pertinent negatives include no back pain, chills, dysuria, fever, frequency, hematuria or urgency. The vaginal discharge was white, thin and malodorous. There has been no bleeding. She has not been passing clots. She has not been passing tissue. Nothing aggravates the symptoms. She has tried nothing for the symptoms. The treatment provided no relief. She is not sexually active.     Problems:  Patient Active Problem List   Diagnosis Date Noted   Severe major depression, single episode 04/10/2022   PTSD (post-traumatic stress disorder) 04/10/2022   Alcohol use disorder, mild, in early remission 04/10/2022   Advanced maternal age in multigravida, third trimester 09/14/2021   MDD (major depressive disorder), recurrent episode 12/07/2018   Pancreatitis     Allergies:  Allergies  Allergen Reactions  Avocado Other (See Comments)    Pancreatitis   Medications:  Current Outpatient Medications:    metroNIDAZOLE (FLAGYL) 500 MG tablet, Take 1 tablet (500 mg total) by mouth 2 (two) times daily for 7 days., Disp: 14 tablet, Rfl: 0   chlordiazePOXIDE (LIBRIUM) 25 MG capsule, 50mg  PO TID x 1D, then 25-50mg  PO BID X 1D, then  25-50mg  PO QD X 1D (Patient taking differently: Take 50 mg by mouth as directed. 50mg  PO TID x 1D, then 25-50mg  PO BID X 1D, then 25-50mg  PO QD X 1D), Disp: 10 capsule, Rfl: 0   ondansetron (ZOFRAN-ODT) 8 MG disintegrating tablet, 8mg  ODT q4 hours prn nausea, Disp: 4 tablet, Rfl: 0  Observations/Objective: Patient is well-developed, well-nourished in no acute distress.  Resting comfortably   Head is normocephalic, atraumatic.  No labored breathing.  Speech is clear and coherent with logical content.  Patient is alert and oriented at baseline.    Assessment and Plan: 1. BV (bacterial vaginosis) - metroNIDAZOLE (FLAGYL) 500 MG tablet; Take 1 tablet (500 mg total) by mouth 2 (two) times daily for 7 days.  Dispense: 14 tablet; Refill: 0  - Symptoms consistent with BV - Metronidazole prescribed - Limit bubble baths, scented lotions/soaps/detergents - Limit tight fitting clothing - Seek on person evaluation if not improving or if symptoms worsen   Follow Up Instructions: I discussed the assessment and treatment plan with the patient. The patient was provided an opportunity to ask questions and all were answered. The patient agreed with the plan and demonstrated an understanding of the instructions.  A copy of instructions were sent to the patient via MyChart unless otherwise noted below.    The patient was advised to call back or seek an in-person evaluation if the symptoms worsen or if the condition fails to improve as anticipated.  Time:  I spent 8 minutes with the patient via telehealth technology discussing the above problems/concerns.    Mar Daring, PA-C

## 2022-09-11 NOTE — Patient Instructions (Signed)
Isaac Laud, thank you for joining Mar Daring, PA-C for today's virtual visit.  While this provider is not your primary care provider (PCP), if your PCP is located in our provider database this encounter information will be shared with them immediately following your visit.   [facility] account gives you access to today's visit and all your visits, tests, and labs performed at [facility] " click here if you don't have a [facility] account or go to [email or web address]  Consent: (Patient) Diana Espinoza provided verbal consent for this virtual visit at the beginning of the encounter.  Current Medications:  Current Outpatient Medications:    metroNIDAZOLE (FLAGYL) 500 MG tablet, Take 1 tablet (500 mg total) by mouth 2 (two) times daily for 7 days., Disp: 14 tablet, Rfl: 0   chlordiazePOXIDE (LIBRIUM) 25 MG capsule, 50mg  PO TID x 1D, then 25-50mg  PO BID X 1D, then 25-50mg  PO QD X 1D (Patient taking differently: Take 50 mg by mouth as directed. 50mg  PO TID x 1D, then 25-50mg  PO BID X 1D, then 25-50mg  PO QD X 1D), Disp: 10 capsule, Rfl: 0   ondansetron (ZOFRAN-ODT) 8 MG disintegrating tablet, 8mg  ODT q4 hours prn nausea, Disp: 4 tablet, Rfl: 0   Medications ordered in this encounter:  Meds ordered this encounter  Medications   metroNIDAZOLE (FLAGYL) 500 MG tablet    Sig: Take 1 tablet (500 mg total) by mouth 2 (two) times daily for 7 days.    Dispense:  14 tablet    Refill:  0    Order Specific Question:   Supervising Provider    Answer:   Chase Picket [id number]     *If you need refills on other medications prior to your next appointment, please contact your pharmacy*  Follow-Up: Call back or seek an in-person evaluation if the symptoms worsen or if the condition fails to improve as anticipated.  [place] [phone]  Other Instructions  Bacterial Vaginosis  Bacterial vaginosis is an infection that occurs  when the normal balance of bacteria in the vagina changes. This change is caused by an overgrowth of certain bacteria in the vagina. Bacterial vaginosis is the most common vaginal infection among females aged 34 to 38 years. This condition increases the risk of sexually transmitted infections (STIs). Treatment can help reduce this risk. Treatment is very important for pregnant women because this condition can cause babies to be born early (prematurely) or at a low birth weight. What are the causes? This condition is caused by an increase in harmful bacteria that are normally present in small amounts in the vagina. However, the exact reason this condition develops is not known. You cannot get bacterial vaginosis from toilet seats, bedding, swimming pools, or contact with objects around you. What increases the risk? The following factors may make you more likely to develop this condition: Having a new sexual partner or multiple sexual partners, or having unprotected sex. Douching. Having an intrauterine device (IUD). Smoking. Abusing drugs and alcohol. This may lead to riskier sexual behavior. Taking certain antibiotic medicines. Being pregnant. What are the signs or symptoms? Some women with this condition have no symptoms. Symptoms may include: [place] Cables or white vaginal discharge. The discharge can be watery or foamy. A fish-like odor with discharge, especially after sex or during menstruation. Itching in and around the vagina. Burning or pain with urination. How is this diagnosed? This condition is diagnosed based on: Your medical history. A  physical exam of the vagina. Checking a sample of vaginal fluid for harmful bacteria or abnormal cells. How is this treated? This condition is treated with antibiotic medicines. These may be given as a pill, a vaginal cream, or a medicine that is put into the vagina (suppository). If the condition comes back after treatment, a second round of antibiotics  may be needed. Follow these instructions at home: Medicines Take or apply over-the-counter and prescription medicines only as told by your health care provider. Take or apply your antibiotic medicine as told by your health care provider. Do not stop using the antibiotic even if you start to feel better. General instructions If you have a female sexual partner, tell her that you have a vaginal infection. She should follow up with her health care provider. If you have a female sexual partner, he does not need treatment. Avoid sexual activity until you finish treatment. Drink enough fluid to keep your urine pale yellow. Keep the area around your vagina and rectum clean. Wash the area daily with warm water. Wipe yourself from front to back after using the toilet. If you are breastfeeding, talk to your health care provider about continuing breastfeeding during treatment. Keep all follow-up visits. This is important. How is this prevented? Self-care Do not douche. Wash the outside of your vagina with warm water only. Wear cotton or cotton-lined underwear. Avoid wearing tight pants and pantyhose, especially during the summer. Safe sex Use protection when having sex. This includes: Using condoms. Using dental dams. This is a thin layer of a material made of latex or polyurethane that protects the mouth during oral sex. Limit the number of sexual partners. To help prevent bacterial vaginosis, it is best to have sex with just one partner (monogamous relationship). Make sure you and your sexual partner are tested for STIs. Drugs and alcohol Do not use any products that contain nicotine or tobacco. These products include cigarettes, chewing tobacco, and vaping devices, such as e-cigarettes. If you need help quitting, ask your health care provider. Do not use drugs. Do not drink alcohol if: Your health care provider tells you not to do this. You are pregnant, may be pregnant, or are planning to  become pregnant. If you drink alcohol: Limit how much you have to 0-1 drink a day. Be aware of how much alcohol is in your drink. In the [place] one drink equals one 12 oz bottle of beer (355 mL), one 5 oz glass of wine (148 mL), or one 1 oz glass of hard liquor (44 mL). Where to find more information Centers for Disease Control and Prevention: http://www.wolf.info/ American Sexual Health Association (ASHA): www.ashastd.org U.S. Department of Health and Financial controller, Office on Women's Health: [place]Signs.tn Contact a health care provider if: Your symptoms do not improve, even after treatment. You have more discharge or pain when urinating. You have a fever or chills. You have pain in your abdomen or pelvis. You have pain during sex. You have vaginal bleeding between menstrual periods. Summary Bacterial vaginosis is a vaginal infection that occurs when the normal balance of bacteria in the vagina changes. It results from an overgrowth of certain bacteria. This condition increases the risk of sexually transmitted infections (STIs). Getting treated can help reduce this risk. Treatment is very important for pregnant women because this condition can cause babies to be born early (prematurely) or at low birth weight. This condition is treated with antibiotic medicines. These may be given as a pill, a vaginal cream, or  a medicine that is put into the vagina (suppository). This information is not intended to replace advice given to you by your health care provider. Make sure you discuss any questions you have with your health care provider. Document Revised: 11/25/2019 Document Reviewed: 11/25/2019 Elsevier Patient Education  [place].    If you have been instructed to have an in-person evaluation today at a local Urgent Care facility, please use the link below. It will take you to a list of all of our available [facility], including address, phone number and hours of  operation. Please do not delay care.  [facility]  If you or a family member do not have a primary care provider, use the link below to schedule a visit and establish care. When you choose a [facility] primary care physician or advanced practice provider, you gain a long-term partner in health. Find a Primary Care Provider  Learn more about [facility]'s in-office and virtual care options: [facility]

## 2022-09-25 ENCOUNTER — Ambulatory Visit (HOSPITAL_COMMUNITY): Payer: No Typology Code available for payment source | Admitting: Mental Health

## 2022-09-25 DIAGNOSIS — F1011 Alcohol abuse, in remission: Secondary | ICD-10-CM

## 2022-09-25 DIAGNOSIS — F4310 Post-traumatic stress disorder, unspecified: Secondary | ICD-10-CM

## 2022-09-25 DIAGNOSIS — F322 Major depressive disorder, single episode, severe without psychotic features: Secondary | ICD-10-CM

## 2022-09-25 DIAGNOSIS — F1010 Alcohol abuse, uncomplicated: Secondary | ICD-10-CM

## 2022-09-25 NOTE — Progress Notes (Signed)
THERAPIST PROGRESS NOTE Virtual Visit via Video Note  I connected with Diana Espinoza on 09/25/22 at  1:00 PM EDT by a video enabled telemedicine application and verified that I am speaking with the correct person using two identifiers.  Location: Patient: work  Provider: office   I discussed the limitations of evaluation and management by telemedicine and the availability of in person appointments. The patient expressed understanding and agreed to proceed.  I discussed the assessment and treatment plan with the patient. The patient was provided an opportunity to ask questions and all were answered. The patient agreed with the plan and demonstrated an understanding of the instructions.   The patient was advised to call back or seek an in-person evaluation if the symptoms worsen or if the condition fails to improve as anticipated.  I provided 20 minutes of non-face-to-face time during this encounter.   Diana Espinoza, [facility]   Session Time: 1:07am (20 minutes)  Participation Level: Active  Behavioral Response: NeatAlertBlunted  Type of Therapy: Individual Therapy  Treatment Goals addressed: STG: Diana Espinoza will increase management of mood (depression/anxiety)AEB development of effective coping skills with ability to reframe maladaptive thinking patterns daily within the next 6 months   Diana Espinoza will maintain sobriety from substances AEB development of relapse prevention plan and exploration of triggers within the next 90 days.    ProgressTowards Goals: Progressing  Interventions: CBT and Supportive  Summary: Diana Espinoza is a 42 y.o. female who presents with dx of MDD, PTSD and AUD. Presents for session alert and oriented; mood and affect blunted. Speech clear and coherent at normal rate and tone. Limited engagement with therapist. Notes to be at work and able to hold session. Shares to feel as if she is doing ok; appears guarded. Notes to have refrained from drinking  behaviors and has been attending online AA meetings. Shares most likely will not be able to obtain unsupervised visits with her daughter after last visit with next court hearing to be adjudication. Shares ongoing ability to spend time with her other daily and sees her daily. Shares engagement with older daughters father to be going well and shares for him to respect her choice not to drink. Notes for moods to be stable. In need of ending session prematurely due to needing to travel with work. Denies safety concerns. Sxs appear to be stable at this time. States sobriety.   Suicidal/Homicidal: Nowithout intent/plan  Therapist Response: Therapist engaged Diana Espinoza in [employer]. Assessed for current location and ability to hold confidential session. Completed check in and assessed for current level of functioning, sxs management and level of stressors. Engaged Diana Espinoza in exploring current level of functioning and ability to maintain sobriety. Explored engagement with daughter's father which was reported to be a contributing factor to relapse previously. Assessed for current mood stability. Assessed for safety concerns. Provided follow up.   Plan: Return again in  x 4 weeks.  Diagnosis: Severe major depression, single episode  PTSD (post-traumatic stress disorder)  Alcohol use disorder, mild, abuse  Alcohol use disorder, mild, in early remission  Collaboration of Care: Other None  Patient/Guardian was advised Release of Information must be obtained prior to any record release in order to collaborate their care with an outside provider. Patient/Guardian was advised if they have not already done so to contact the registration department to sign all necessary forms in order for [place] to release information regarding their care.   Consent: Patient/Guardian gives verbal consent for treatment and assignment  of benefits for services provided during this visit. Patient/Guardian expressed understanding and  agreed to proceed.   Stephan Minister [place], [facility] 09/25/2022

## 2022-09-30 ENCOUNTER — Encounter (HOSPITAL_COMMUNITY)
Payer: No Typology Code available for payment source | Admitting: Student in an Organized Health Care Education/Training Program
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
## 2022-10-09 ENCOUNTER — Telehealth (HOSPITAL_COMMUNITY): Payer: Self-pay | Admitting: Mental Health

## 2022-10-09 ENCOUNTER — Encounter (HOSPITAL_COMMUNITY): Payer: Self-pay

## 2022-10-09 ENCOUNTER — Ambulatory Visit (HOSPITAL_COMMUNITY): Payer: No Typology Code available for payment source | Admitting: Mental Health

## 2022-10-09 NOTE — Telephone Encounter (Signed)
Sent Pt link for tele-therapy session. No response after x 10 minutes. Contacted pt via telephone, no answer. Left HIPAA compliant message. NS

## 2022-10-14 ENCOUNTER — Emergency Department (HOSPITAL_COMMUNITY)
Admission: EM | Admit: 2022-10-14 | Discharge: 2022-10-14 | Discharge status: Against Medical Advice | Payer: No Typology Code available for payment source

## 2022-10-14 NOTE — ED Notes (Signed)
Pt called for triage w/o answer.

## 2022-10-17 ENCOUNTER — Telehealth: Payer: No Typology Code available for payment source

## 2022-10-17 ENCOUNTER — Telehealth: Payer: No Typology Code available for payment source | Admitting: Nurse Practitioner

## 2022-10-17 DIAGNOSIS — G4489 Other headache syndrome: Secondary | ICD-10-CM

## 2022-10-17 NOTE — Patient Instructions (Addendum)
Follow up with your [facility] team in the am for medication adjustment instructions  If you have an acute need tonight:   [facility]  [email or web address]  [address] [place], [place] [address] HelpLine: [phone] or [phone]

## 2022-10-17 NOTE — Progress Notes (Signed)
Virtual Visit Consent   Diana Espinoza, you are scheduled for a virtual visit with a [facility] provider today. Just as with appointments in the office, your consent must be obtained to participate. Your consent will be active for this visit and any virtual visit you may have with one of our providers in the next 365 days. If you have a MyChart account, a copy of this consent can be sent to you electronically.  As this is a virtual visit, video technology does not allow for your provider to perform a traditional examination. This may limit your provider's ability to fully assess your condition. If your provider identifies any concerns that need to be evaluated in person or the need to arrange testing (such as labs, EKG, etc.), we will make arrangements to do so. Although advances in technology are sophisticated, we cannot ensure that it will always work on either your end or our end. If the connection with a video visit is poor, the visit may have to be switched to a telephone visit. With either a video or telephone visit, we are not always able to ensure that we have a secure connection.  By engaging in this virtual visit, you consent to the provision of healthcare and authorize for your insurance to be billed (if applicable) for the services provided during this visit. Depending on your insurance coverage, you may receive a charge related to this service.  I need to obtain your verbal consent now. Are you willing to proceed with your visit today? Diana Espinoza has provided verbal consent on 10/17/2022 for a virtual visit (video or telephone). Viviano Simas, FNP  Date: 10/17/2022 5:12 PM  Virtual Visit via Video Note   I, Viviano Simas, connected with  Diana Espinoza  ([id number], 1980/08/02) on 10/17/22 at  5:15 PM EDT by a video-enabled telemedicine application and verified that I am speaking with the correct person using two identifiers.  Location: Patient: Virtual Visit Location Patient:  Home Provider: Virtual Visit Location Provider: Home Office   I discussed the limitations of evaluation and management by telemedicine and the availability of in person appointments. The patient expressed understanding and agreed to proceed.    History of Present Illness: Diana Espinoza is a 42 y.o. who identifies as a female who was assigned female at birth, and is being seen today for headaches.  4 nights ago she experienced nausea and dizziness  The following day she started to experience headaches she also had nausea/vomiting and diarrhea    She was started on gabapentin 100mg  last week by [facility]* *Was prescribed this medication in January but did not start it until last week because that is when she ran out of her Tegretol  She as given this Rx to take as needed for panic attacks She took two doses the day she started to have symptoms continued the medicine for 2 days and stopped 2 days ago when she realized the medicine was likely causing her symptoms   Has not vomited since yesterday morning  She was able to eat today  She has been drinking water and Gatorade and has been urinating  Last episode of diarrhea was yesterday    Today her residual symptom is the headache and weakness due to loss of intake over the past two days   She was able to take a Zofran last night   Problems:  Patient Active Problem List   Diagnosis Date Noted   Severe major depression, single episode (  HCC) 04/10/2022   PTSD (post-traumatic stress disorder) 04/10/2022   Alcohol use disorder, mild, in early remission 04/10/2022   Advanced maternal age in multigravida, third trimester 09/14/2021   MDD (major depressive disorder), recurrent episode (HCC) 12/07/2018   Pancreatitis     Allergies:  Allergies  Allergen Reactions   Avocado Other (See Comments)    Pancreatitis   Medications:  Current Outpatient Medications:    chlordiazePOXIDE (LIBRIUM) 25 MG capsule, 50mg  PO TID x 1D, then 25-50mg  PO BID X  1D, then 25-50mg  PO QD X 1D (Patient taking differently: Take 50 mg by mouth as directed. 50mg  PO TID x 1D, then 25-50mg  PO BID X 1D, then 25-50mg  PO QD X 1D), Disp: 10 capsule, Rfl: 0   ondansetron (ZOFRAN-ODT) 8 MG disintegrating tablet, 8mg  ODT q4 hours prn nausea, Disp: 4 tablet, Rfl: 0  Observations/Objective: Patient is well-developed, well-nourished in no acute distress.  Resting comfortably  at home.  Head is normocephalic, atraumatic.  No labored breathing.  Speech is clear and coherent with logical content.  Patient is alert and oriented at baseline.    Assessment and Plan: 1. Other headache syndrome Stop Gabapentin  Contact BH for medication administration change assistance   Continue to push fluids, Zofran as needed for nausea  Monitor hydration as discussed  Bland foods (BRAT) diet and slowly advance as tolerated   Call BH in the am  UC information given      Follow Up Instructions: I discussed the assessment and treatment plan with the patient. The patient was provided an opportunity to ask questions and all were answered. The patient agreed with the plan and demonstrated an understanding of the instructions.  A copy of instructions were sent to the patient via MyChart unless otherwise noted below.    The patient was advised to call back or seek an in-person evaluation if the symptoms worsen or if the condition fails to improve as anticipated.  Time:  I spent 15 minutes with the patient via telehealth technology discussing the above problems/concerns.    Viviano Simas, FNP

## 2022-10-17 NOTE — Progress Notes (Signed)
Needs work note see previous visit

## 2022-12-01 ENCOUNTER — Telehealth: Payer: PRIVATE HEALTH INSURANCE | Admitting: Family

## 2022-12-01 DIAGNOSIS — B029 Zoster without complications: Secondary | ICD-10-CM

## 2022-12-01 MED ORDER — VALACYCLOVIR HCL 1 G PO TABS: 1000.0000 mg | ORAL_TABLET | Freq: Three times a day (TID) | ORAL | 0 refills | Status: AC

## 2022-12-01 NOTE — Patient Instructions (Signed)

## 2022-12-01 NOTE — Progress Notes (Signed)
Virtual Visit Consent   Diana Espinoza, you are scheduled for a virtual visit with a [facility] provider today. Just as with appointments in the office, your consent must be obtained to participate. Your consent will be active for this visit and any virtual visit you may have with one of our providers in the next 365 days. If you have a MyChart account, a copy of this consent can be sent to you electronically.  As this is a virtual visit, video technology does not allow for your provider to perform a traditional examination. This may limit your provider's ability to fully assess your condition. If your provider identifies any concerns that need to be evaluated in person or the need to arrange testing (such as labs, EKG, etc.), we will make arrangements to do so. Although advances in technology are sophisticated, we cannot ensure that it will always work on either your end or our end. If the connection with a video visit is poor, the visit may have to be switched to a telephone visit. With either a video or telephone visit, we are not always able to ensure that we have a secure connection.  By engaging in this virtual visit, you consent to the provision of healthcare and authorize for your insurance to be billed (if applicable) for the services provided during this visit. Depending on your insurance coverage, you may receive a charge related to this service.  I need to obtain your verbal consent now. Are you willing to proceed with your visit today? Diana Espinoza has provided verbal consent on 12/01/2022 for a virtual visit (video or telephone). Jannifer Rodney, FNP  Date: 12/01/2022 5:08 PM  Virtual Visit via Video Note   I, Jannifer Rodney, connected with  Diana Espinoza  ([id number], Feb 17, 1981) on 12/01/22 at  4:30 PM EDT by a video-enabled telemedicine application and verified that I am speaking with the correct person using two identifiers.  Location: Patient: Virtual Visit Location Patient:  Home Provider: Virtual Visit Location Provider: Home Office   I discussed the limitations of evaluation and management by telemedicine and the availability of in person appointments. The patient expressed understanding and agreed to proceed.    History of Present Illness: Diana Espinoza is a 42 y.o. who identifies as a female who was assigned female at birth, and is being seen today for rash that started two days ago on her left chest.  HPI: Rash This is a new problem. The current episode started in the past 7 days. The problem is unchanged. Location: left chest. The rash is characterized by itchiness and redness.    Problems:  Patient Active Problem List   Diagnosis Date Noted   Severe major depression, single episode (HCC) 04/10/2022   PTSD (post-traumatic stress disorder) 04/10/2022   Alcohol use disorder, mild, in early remission 04/10/2022   Advanced maternal age in multigravida, third trimester 09/14/2021   MDD (major depressive disorder), recurrent episode (HCC) 12/07/2018   Pancreatitis     Allergies:  Allergies  Allergen Reactions   Avocado Other (See Comments)    Pancreatitis   Medications:  Current Outpatient Medications:    valACYclovir (VALTREX) 1000 MG tablet, Take 1 tablet (1,000 mg total) by mouth 3 (three) times daily., Disp: 21 tablet, Rfl: 0   chlordiazePOXIDE (LIBRIUM) 25 MG capsule, 50mg  PO TID x 1D, then 25-50mg  PO BID X 1D, then 25-50mg  PO QD X 1D (Patient taking differently: Take 50 mg by mouth as directed. 50mg  PO TID  x 1D, then 25-50mg  PO BID X 1D, then 25-50mg  PO QD X 1D), Disp: 10 capsule, Rfl: 0   ondansetron (ZOFRAN-ODT) 8 MG disintegrating tablet, 8mg  ODT q4 hours prn nausea, Disp: 4 tablet, Rfl: 0  Observations/Objective: Patient is well-developed, well-nourished in no acute distress.  Resting comfortably  at home.  Head is normocephalic, atraumatic.  No labored breathing.  Speech is clear and coherent with logical content.  Patient is alert and  oriented at baseline.  Erythemas rash, with cluster of blisters on left chest  Assessment and Plan: 1. Herpes zoster without complication - valACYclovir (VALTREX) 1000 MG tablet; Take 1 tablet (1,000 mg total) by mouth 3 (three) times daily.  Dispense: 21 tablet; Refill: 0  Start Valtrex TID  Avoid immune compromise, babies and elderly Rest Wash hands often Follow up if symptoms worsen or do not improve   Follow Up Instructions: I discussed the assessment and treatment plan with the patient. The patient was provided an opportunity to ask questions and all were answered. The patient agreed with the plan and demonstrated an understanding of the instructions.  A copy of instructions were sent to the patient via MyChart unless otherwise noted below.     The patient was advised to call back or seek an in-person evaluation if the symptoms worsen or if the condition fails to improve as anticipated.  Time:  I spent 6 minutes with the patient via telehealth technology discussing the above problems/concerns.    Jannifer Rodney, FNP

## 2023-01-03 ENCOUNTER — Encounter (HOSPITAL_COMMUNITY): Payer: Self-pay

## 2023-01-03 ENCOUNTER — Ambulatory Visit (HOSPITAL_COMMUNITY): Payer: No Typology Code available for payment source | Admitting: Mental Health

## 2023-01-03 ENCOUNTER — Encounter (HOSPITAL_COMMUNITY)
Payer: No Typology Code available for payment source | Admitting: Student in an Organized Health Care Education/Training Program

## 2023-01-03 DIAGNOSIS — F322 Major depressive disorder, single episode, severe without psychotic features: Secondary | ICD-10-CM | POA: Diagnosis not present

## 2023-01-03 DIAGNOSIS — F4310 Post-traumatic stress disorder, unspecified: Secondary | ICD-10-CM

## 2023-01-03 DIAGNOSIS — F1010 Alcohol abuse, uncomplicated: Secondary | ICD-10-CM
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
## 2023-02-17 ENCOUNTER — Ambulatory Visit (INDEPENDENT_AMBULATORY_CARE_PROVIDER_SITE_OTHER): Payer: MEDICAID | Admitting: Mental Health

## 2023-02-17 DIAGNOSIS — F1011 Alcohol abuse, in remission: Secondary | ICD-10-CM

## 2023-02-17 DIAGNOSIS — F4310 Post-traumatic stress disorder, unspecified: Secondary | ICD-10-CM | POA: Diagnosis not present

## 2023-02-17 DIAGNOSIS — F1091 Alcohol use, unspecified, in remission: Secondary | ICD-10-CM | POA: Diagnosis not present

## 2023-02-17 DIAGNOSIS — F322 Major depressive disorder, single episode, severe without psychotic features: Secondary | ICD-10-CM

## 2023-02-17 NOTE — Progress Notes (Unsigned)
THERAPIST PROGRESS NOTE Virtual Visit via Video Note  I connected with Diana Espinoza on 02/17/23 at  3:00 PM EDT by a video enabled telemedicine application and verified that I am speaking with the correct person using two identifiers.  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 The patient expressed understanding and agreed to proceed.  I discussed the assessment and treatment plan with the patient. The patient was provided an opportunity to ask questions and all were answered. The patient agreed with the plan and demonstrated an understanding of the instructions.   The patient was advised to call back or seek an in-person evaluation if the symptoms worsen or if the condition fails to improve as anticipated.  I provided 45 minutes of non-face-to-face time during this encounter.   Dorris Singh, [facility]   Session Time: 3:01pm (45  minutes)  Participation Level: Active  Behavioral Response: CasualAlertEuthymic  Type of Therapy: Individual Therapy  Treatment Goals addressed:  STG: Lavoris will increase management of mood (depression/anxiety)AEB development of effective coping skills with ability to reframe maladaptive thinking patterns daily within the next 6 months    Roselin will maintain sobriety from substances AEB development of relapse prevention plan and exploration of triggers within the next 90 days.   ProgressTowards Goals: Progressing  Interventions: CBT and Supportive  Summary: Diana Espinoza is a 42 y.o. female who presents with dx of MDD, PTSD and AUD in early remission. Presents for session alert and oriented; mood and affect adequate. Speech clear and coherent at normal rate and tone. Engaged and receptive to interventions. Shares to be doing well and for moods to be stable. Denies concerns for depression or anxiety at this  time. Reports ongoing sobriety in which she has been sober for the past x 6 months. Notes for daughter's father to have also decided to achieve sobriety and has been pursuing treatment, "He said I inspired him." Shares to have started to work new position and to enjoy position thus far. Shares working to continue to focus on healthy life style changes and maintaining good boundaries with self and others. Shares usign writing as coping skills and tried not to focus on things she can not control or going her way. Shares thoughts and interactions with eldest daughter and establishing boundaries with her. Processed with therapist ability to not take on other's emotions which ahs been a factor for her in the past and contributing to feelings of stress and anxiety. Notes working on emotional boundaries. Shares for sleep and appetite to be adequate.    Suicidal/Homicidal: Nowithout intent/plan  Therapist Response:  Therapist engaged Genene in [employer]. Assessed for current location and ability to hold confidential session. Completed check in and assessed for current level of functioning, sxs management and level of stressors. Assessed for mood concerns and factors that have supported in mental health. Supported in processing ability to engage in healthy coping skills and allowing self to process emotions and not avoiding and discounting unpleasant emotions. Discussed ability to set boundaries with others and emotional boundaries with self and maintains positive healthy relationships with others. Reviewed current status of court case with CPS. Reviewed stability in moods and drinking behaviors. Reviewed session and provided follow up.   Plan: Return again in  x 5 weeks.  Diagnosis: Severe major depression, single episode (HCC)  PTSD (post-traumatic stress disorder)  Alcohol use disorder, mild,  in early remission  Collaboration of Care: Other None  Patient/Guardian was advised Release of  Information must be obtained prior to any record release in order to collaborate their care with an outside provider. Patient/Guardian was advised if they have not already done so to contact the registration department to sign all necessary forms in order for [place] to release information regarding their care.   Consent: Patient/Guardian gives verbal consent for treatment and assignment of benefits for services provided during this visit. Patient/Guardian expressed understanding and agreed to proceed.   Stephan Minister [place], [facility] 02/17/2023

## 2023-04-01 ENCOUNTER — Ambulatory Visit (INDEPENDENT_AMBULATORY_CARE_PROVIDER_SITE_OTHER): Payer: No Typology Code available for payment source | Admitting: Mental Health

## 2023-04-01 DIAGNOSIS — F4310 Post-traumatic stress disorder, unspecified: Secondary | ICD-10-CM

## 2023-04-01 DIAGNOSIS — F322 Major depressive disorder, single episode, severe without psychotic features: Secondary | ICD-10-CM

## 2023-04-01 DIAGNOSIS — F1011 Alcohol abuse, in remission: Secondary | ICD-10-CM
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
## 2023-04-27 ENCOUNTER — Telehealth: Payer: No Typology Code available for payment source

## 2023-04-27 ENCOUNTER — Telehealth: Payer: No Typology Code available for payment source | Admitting: Family

## 2023-04-27 ENCOUNTER — Telehealth: Payer: No Typology Code available for payment source | Admitting: Family Medicine

## 2023-04-27 DIAGNOSIS — G4700 Insomnia, unspecified: Secondary | ICD-10-CM

## 2023-04-27 DIAGNOSIS — F411 Generalized anxiety disorder: Secondary | ICD-10-CM | POA: Diagnosis not present

## 2023-04-27 DIAGNOSIS — K2100 Gastro-esophageal reflux disease with esophagitis, without bleeding: Secondary | ICD-10-CM

## 2023-04-27 MED ORDER — HYDROXYZINE PAMOATE 25 MG PO CAPS: 25.0000 mg | ORAL_CAPSULE | Freq: Three times a day (TID) | ORAL | 1 refills | Status: DC | PRN

## 2023-04-27 MED ORDER — OMEPRAZOLE 20 MG PO CPDR: 20.0000 mg | DELAYED_RELEASE_CAPSULE | Freq: Every day | ORAL | 1 refills | Status: AC

## 2023-04-27 NOTE — Progress Notes (Signed)
E-Visit for Heartburn  We are sorry that you are not feeling well.  Here is how we plan to help!  Based on what you shared with me it looks like you most likely have Gastroesophageal Reflux Disease (GERD)  Gastroesophageal reflux disease (GERD) happens when acid from your stomach flows up into the esophagus.  When acid comes in contact with the esophagus, the acid causes sorenss (inflammation) in the esophagus.  Over time, GERD may create small holes (ulcers) in the lining of the esophagus.  I have prescribed Omeprazole 20 mg one by mouth daily until you follow up with a provider.  Acid reflux does not cause weakness or pale. If this continues you need to be seen in person.   Your symptoms should improve in the next day or two.  You can use antacids as needed until symptoms resolve.  Call us if your heartburn worsens, you have trouble swallowing, weight loss, spitting up blood or recurrent vomiting.  Home Care: May include lifestyle changes such as weight loss, quitting smoking and alcohol consumption Avoid foods and drinks that make your symptoms worse, such as: Caffeine or alcoholic drinks Chocolate Peppermint or mint flavorings Garlic and onions Spicy foods Citrus fruits, such as oranges, lemons, or limes Tomato-based foods such as sauce, chili, salsa and pizza Fried and fatty foods Avoid lying down for 3 hours prior to your bedtime or prior to taking a nap Eat small, frequent meals instead of a large meals Wear loose-fitting clothing.  Do not wear anything tight around your waist that causes pressure on your stomach. Raise the head of your bed 6 to 8 inches with wood blocks to help you sleep.  Extra pillows will not help.  Seek Help Right Away If: You have pain in your arms, neck, jaw, teeth or back Your pain increases or changes in intensity or duration You develop nausea, vomiting or sweating (diaphoresis) You develop shortness of breath or you faint Your vomit is green,  yellow, black or looks like coffee grounds or blood Your stool is red, bloody or black  These symptoms could be signs of other problems, such as heart disease, gastric bleeding or esophageal bleeding.  Make sure you : Understand these instructions. Will watch your condition. Will get help right away if you are not doing well or get worse.  Your e-visit answers were reviewed by a board certified advanced clinical practitioner to complete your personal care plan.  Depending on the condition, your plan could have included both over the counter or prescription medications.  If there is a problem please reply  once you have received a response from your provider.  Your safety is important to [place].  If you have drug allergies check your prescription carefully.    You can use MyChart to ask questions about today's visit, request a non-urgent call back, or ask for a work or school excuse for 24 hours related to this e-Visit. If it has been greater than 24 hours you will need to follow up with your provider, or enter a new e-Visit to address those concerns.  You will get an e-mail in the next two days asking about your experience.  I hope that your e-visit has been valuable and will speed your recovery. Thank you for using e-visits.  Approximately 5 minutes was spent documenting and reviewing patient's chart.

## 2023-04-27 NOTE — Progress Notes (Signed)
Virtual Visit Consent   Diana Espinoza, you are scheduled for a virtual visit with a [facility] provider today. Just as with appointments in the office, your consent must be obtained to participate. Your consent will be active for this visit and any virtual visit you may have with one of our providers in the next 365 days. If you have a MyChart account, a copy of this consent can be sent to you electronically.  As this is a virtual visit, video technology does not allow for your provider to perform a traditional examination. This may limit your provider's ability to fully assess your condition. If your provider identifies any concerns that need to be evaluated in person or the need to arrange testing (such as labs, EKG, etc.), we will make arrangements to do so. Although advances in technology are sophisticated, we cannot ensure that it will always work on either your end or our end. If the connection with a video visit is poor, the visit may have to be switched to a telephone visit. With either a video or telephone visit, we are not always able to ensure that we have a secure connection.  By engaging in this virtual visit, you consent to the provision of healthcare and authorize for your insurance to be billed (if applicable) for the services provided during this visit. Depending on your insurance coverage, you may receive a charge related to this service.  I need to obtain your verbal consent now. Are you willing to proceed with your visit today? Diana Espinoza has provided verbal consent on 04/27/2023 for a virtual visit (video or telephone). Jannifer Rodney, FNP  Date: 04/27/2023 6:15 PM  Virtual Visit via Video Note   I, Jannifer Rodney, connected with  Diana Espinoza  ([id number], Mar 02, 1981) on 04/27/23 at  6:15 PM EST by a video-enabled telemedicine application and verified that I am speaking with the correct person using two identifiers.  Location: Patient: Virtual Visit Location Patient:  Home Provider: Virtual Visit Location Provider: Home Office   I discussed the limitations of evaluation and management by telemedicine and the availability of in person appointments. The patient expressed understanding and agreed to proceed.    History of Present Illness: Diana Espinoza is a 42 y.o. who identifies as a female who was assigned female at birth, and is being seen today for anxiety. Reports she has a hx of anxiety, however, she is going through a custody battle for her 20 month currently.   HPI: Anxiety Presents for follow-up visit. Symptoms include excessive worry, hyperventilation, insomnia, irritability, nervous/anxious behavior, palpitations, panic and restlessness. Patient reports no suicidal ideas. Symptoms occur most days. The severity of symptoms is mild.      Problems:  Patient Active Problem List   Diagnosis Date Noted   Severe major depression, single episode (HCC) 04/10/2022   PTSD (post-traumatic stress disorder) 04/10/2022   Alcohol use disorder, mild, in early remission 04/10/2022   Advanced maternal age in multigravida, third trimester 09/14/2021   MDD (major depressive disorder), recurrent episode (HCC) 12/07/2018   Pancreatitis     Allergies:  Allergies  Allergen Reactions   Avocado Other (See Comments)    Pancreatitis   Medications:  Current Outpatient Medications:    hydrOXYzine (VISTARIL) 25 MG capsule, Take 1 capsule (25 mg total) by mouth every 8 (eight) hours as needed., Disp: 60 capsule, Rfl: 1   chlordiazePOXIDE (LIBRIUM) 25 MG capsule, 50mg  PO TID x 1D, then 25-50mg  PO BID X 1D,  then 25-50mg  PO QD X 1D (Patient taking differently: Take 50 mg by mouth as directed. 50mg  PO TID x 1D, then 25-50mg  PO BID X 1D, then 25-50mg  PO QD X 1D), Disp: 10 capsule, Rfl: 0   omeprazole (PRILOSEC) 20 MG capsule, Take 1 capsule (20 mg total) by mouth daily., Disp: 90 capsule, Rfl: 1   ondansetron (ZOFRAN-ODT) 8 MG disintegrating tablet, 8mg  ODT q4 hours prn  nausea, Disp: 4 tablet, Rfl: 0   valACYclovir (VALTREX) 1000 MG tablet, Take 1 tablet (1,000 mg total) by mouth 3 (three) times daily., Disp: 21 tablet, Rfl: 0  Observations/Objective: Patient is well-developed, well-nourished in no acute distress.  Resting comfortably  at home.  Head is normocephalic, atraumatic.  No labored breathing. Speech is clear and coherent with logical content.  Patient is alert and oriented at baseline.    Assessment and Plan: 1. GAD (generalized anxiety disorder) - hydrOXYzine (VISTARIL) 25 MG capsule; Take 1 capsule (25 mg total) by mouth every 8 (eight) hours as needed.  Dispense: 60 capsule; Refill: 1  2. Insomnia, unspecified type - hydrOXYzine (VISTARIL) 25 MG capsule; Take 1 capsule (25 mg total) by mouth every 8 (eight) hours as needed.  Dispense: 60 capsule; Refill: 1  Recommend following up with PCP to discuss other medications that she takes daily Stress management  Vistaril as needed  Follow Up Instructions: I discussed the assessment and treatment plan with the patient. The patient was provided an opportunity to ask questions and all were answered. The patient agreed with the plan and demonstrated an understanding of the instructions.  A copy of instructions were sent to the patient via MyChart unless otherwise noted below.    The patient was advised to call back or seek an in-person evaluation if the symptoms worsen or if the condition fails to improve as anticipated.    Jannifer Rodney, FNP

## 2023-04-27 NOTE — Progress Notes (Signed)
Pt did not show for visit. DWB 

## 2023-04-29 ENCOUNTER — Telehealth: Payer: No Typology Code available for payment source | Admitting: Physician Assistant

## 2023-04-29 DIAGNOSIS — N3090 Cystitis, unspecified without hematuria: Secondary | ICD-10-CM | POA: Diagnosis not present

## 2023-04-29 MED ORDER — CEPHALEXIN 500 MG PO CAPS: 500.0000 mg | ORAL_CAPSULE | Freq: Two times a day (BID) | ORAL | 0 refills | Status: AC

## 2023-04-29 NOTE — Progress Notes (Signed)
Ms. soley, liefer are scheduled for a virtual visit with your provider today.    Just as we do with appointments in the office, we must obtain your consent to participate.  Your consent will be active for this visit and any virtual visit you may have with one of our providers in the next 365 days.    If you have a MyChart account, I can also send a copy of this consent to you electronically.  All virtual visits are billed to your insurance company just like a traditional visit in the office.  As this is a virtual visit, video technology does not allow for your provider to perform a traditional examination.  This may limit your provider's ability to fully assess your condition.  If your provider identifies any concerns that need to be evaluated in person or the need to arrange testing such as labs, EKG, etc, we will make arrangements to do so.    Although advances in technology are sophisticated, we cannot ensure that it will always work on either your end or our end.  If the connection with a video visit is poor, we may have to switch to a telephone visit.  With either a video or telephone visit, we are not always able to ensure that we have a secure connection.   I need to obtain your verbal consent now.   Are you willing to proceed with your visit today?   Diana Espinoza has provided verbal consent on 04/29/2023 for a virtual visit (video or telephone).   Karrie Meres, PA-C 04/29/2023  12:35 PM   Date:  04/29/2023   ID:  Diana Espinoza, DOB 09/23/1980, MRN [id number]  Patient Location: Home Provider Location: Home Office   Participants: Patient and Provider for Visit and Wrap up  Method of visit: Video  Location of Patient: Home Location of Provider: Home Office Consent was obtain for visit over the video. Services rendered by provider: Visit was performed via video  A video enabled telemedicine application was used and I verified that I am speaking with the correct person using two  identifiers.  PCP:  [facility]   Chief Complaint:  uti  History of Present Illness:    Diana Espinoza is a 42 y.o. female with history as stated below. Presents video telehealth for an acute care visit  Pt is concerned for uti. She reports frequency, urgency, and some discomfort with urination. Sxs started yesterday. She does not believe she has not had any hematuria, fevers, abd pain, nv.    Past Medical, Surgical, Social History, Allergies, and Medications have been Reviewed.  Past Medical History:  Diagnosis Date   Abnormal Pap smear    Anemia    Anxiety    on meds sometimes. Coping effectively   Depression    Pancreatitis    PTSD (post-traumatic stress disorder)    Vaginal Pap smear, abnormal     Current Meds  Medication Sig   cephALEXin (KEFLEX) 500 MG capsule Take 1 capsule (500 mg total) by mouth 2 (two) times daily for 7 days.     Allergies:   Avocado   ROS See HPI for history of present illness.  Physical Exam Neurological:     Mental Status: She is alert.         MDM: Pt reports uti sxs since yesterday. Hx uti and this feels the same. No red flag sxs. Rx for keflex sent       Tests Ordered: No  orders of the defined types were placed in this encounter.   Medication Changes: Meds ordered this encounter  Medications   cephALEXin (KEFLEX) 500 MG capsule    Sig: Take 1 capsule (500 mg total) by mouth 2 (two) times daily for 7 days.    Dispense:  14 capsule    Refill:  0     Disposition:  Follow up  Signed, Karrie Meres, PA-C  04/29/2023 12:35 PM

## 2023-04-29 NOTE — Patient Instructions (Signed)
  [name], thank you for joining Karrie Meres, PA-C for today's virtual visit.  While this provider is not your primary care provider (PCP), if your PCP is located in our provider database this encounter information will be shared with them immediately following your visit.   A [facility] MyChart account gives you access to today's visit and all your visits, tests, and labs performed at [facility] " click here if you don't have a [facility] MyChart account or go to [email or web address]  Consent: (Patient) Diana Espinoza provided verbal consent for this virtual visit at the beginning of the encounter.  Current Medications:  Current Outpatient Medications:    cephALEXin (KEFLEX) 500 MG capsule, Take 1 capsule (500 mg total) by mouth 2 (two) times daily for 7 days., Disp: 14 capsule, Rfl: 0   chlordiazePOXIDE (LIBRIUM) 25 MG capsule, 50mg  PO TID x 1D, then 25-50mg  PO BID X 1D, then 25-50mg  PO QD X 1D (Patient taking differently: Take 50 mg by mouth as directed. 50mg  PO TID x 1D, then 25-50mg  PO BID X 1D, then 25-50mg  PO QD X 1D), Disp: 10 capsule, Rfl: 0   hydrOXYzine (VISTARIL) 25 MG capsule, Take 1 capsule (25 mg total) by mouth every 8 (eight) hours as needed., Disp: 60 capsule, Rfl: 1   omeprazole (PRILOSEC) 20 MG capsule, Take 1 capsule (20 mg total) by mouth daily., Disp: 90 capsule, Rfl: 1   ondansetron (ZOFRAN-ODT) 8 MG disintegrating tablet, 8mg  ODT q4 hours prn nausea, Disp: 4 tablet, Rfl: 0   valACYclovir (VALTREX) 1000 MG tablet, Take 1 tablet (1,000 mg total) by mouth 3 (three) times daily., Disp: 21 tablet, Rfl: 0   Medications ordered in this encounter:  Meds ordered this encounter  Medications   cephALEXin (KEFLEX) 500 MG capsule    Sig: Take 1 capsule (500 mg total) by mouth 2 (two) times daily for 7 days.    Dispense:  14 capsule    Refill:  0     *If you need refills on other medications prior to your next appointment, please contact your  pharmacy*  Follow-Up: Call back or seek an in-person evaluation if the symptoms worsen or if the condition fails to improve as anticipated.  [facility] [phone]  Other Instructions You were given a prescription for antibiotics. Please take the antibiotic prescription fully.   Follow up with your regular doctor in 1 week for reassessment and seek care sooner if your symptoms worsen or fail to improve.    If you have been instructed to have an in-person evaluation today at a local Urgent Care facility, please use the link below. It will take you to a list of all of our available [facility], including address, phone number and hours of operation. Please do not delay care.  [facility]  If you or a family member do not have a primary care provider, use the link below to schedule a visit and establish care. When you choose a [facility] primary care physician or advanced practice provider, you gain a long-term partner in health. Find a Primary Care Provider  Learn more about [facility]'s in-office and virtual care options: [facility] - Get Care Now

## 2023-05-29 ENCOUNTER — Ambulatory Visit (HOSPITAL_COMMUNITY): Payer: No Typology Code available for payment source | Admitting: Mental Health

## 2023-05-29 DIAGNOSIS — F322 Major depressive disorder, single episode, severe without psychotic features: Secondary | ICD-10-CM

## 2023-05-29 DIAGNOSIS — F1011 Alcohol abuse, in remission: Secondary | ICD-10-CM

## 2023-05-29 DIAGNOSIS — F1091 Alcohol use, unspecified, in remission: Secondary | ICD-10-CM | POA: Diagnosis not present

## 2023-05-29 DIAGNOSIS — F4310 Post-traumatic stress disorder, unspecified: Secondary | ICD-10-CM | POA: Diagnosis not present
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
## 2023-06-07 ENCOUNTER — Other Ambulatory Visit: Payer: Self-pay

## 2023-06-07 ENCOUNTER — Emergency Department (HOSPITAL_COMMUNITY)
Admission: EM | Admit: 2023-06-07 | Discharge: 2023-06-07 | Discharge status: Against Medical Advice | Payer: No Typology Code available for payment source | Attending: Emergency Medicine | Admitting: Emergency Medicine

## 2023-06-07 ENCOUNTER — Encounter (HOSPITAL_COMMUNITY): Payer: Self-pay

## 2023-06-07 DIAGNOSIS — R109 Unspecified abdominal pain: Secondary | ICD-10-CM | POA: Insufficient documentation

## 2023-06-07 DIAGNOSIS — R0781 Pleurodynia: Secondary | ICD-10-CM | POA: Diagnosis not present

## 2023-06-07 DIAGNOSIS — Z5321 Procedure and treatment not carried out due to patient leaving prior to being seen by health care provider: Secondary | ICD-10-CM | POA: Diagnosis not present

## 2023-06-07 LAB — CBC
HCT: 39.5 % (ref 36.0–46.0)
Hemoglobin: 12.6 g/dL (ref 12.0–15.0)
MCH: 29.4 pg (ref 26.0–34.0)
MCHC: 31.9 g/dL (ref 30.0–36.0)
MCV: 92.3 fL (ref 80.0–100.0)
Platelets: 293 10*3/uL (ref 150–400)
RBC: 4.28 MIL/uL (ref 3.87–5.11)
RDW: 17.9 % — ABNORMAL HIGH (ref 11.5–15.5)
WBC: 7.1 10*3/uL (ref 4.0–10.5)
nRBC: 0 % (ref 0.0–0.2)

## 2023-06-07 LAB — HCG, SERUM, QUALITATIVE: Preg, Serum: NEGATIVE

## 2023-06-07 LAB — COMPREHENSIVE METABOLIC PANEL
ALT: 70 U/L — ABNORMAL HIGH (ref 0–44)
AST: 129 U/L — ABNORMAL HIGH (ref 15–41)
Albumin: 4.1 g/dL (ref 3.5–5.0)
Alkaline Phosphatase: 82 U/L (ref 38–126)
Anion gap: 18 — ABNORMAL HIGH (ref 5–15)
BUN: 8 mg/dL (ref 6–20)
CO2: 22 mmol/L (ref 22–32)
Calcium: 8.9 mg/dL (ref 8.9–10.3)
Chloride: 99 mmol/L (ref 98–111)
Creatinine, Ser: 0.69 mg/dL (ref 0.44–1.00)
GFR, Estimated: >60 mL/min (ref >60)
Glucose, Bld: 144 mg/dL — ABNORMAL HIGH (ref 70–99)
Potassium: 3.1 mmol/L — ABNORMAL LOW (ref 3.5–5.1)
Sodium: 139 mmol/L (ref 135–145)
Total Bilirubin: 0.8 mg/dL (ref <1.2)
Total Protein: 8.6 g/dL — ABNORMAL HIGH (ref 6.5–8.1)

## 2023-06-07 LAB — LIPASE, BLOOD: Lipase: 35 U/L (ref 11–51)

## 2023-06-07 MED ORDER — MORPHINE SULFATE (PF) 4 MG/ML IV SOLN
4.0000 mg | Freq: Once | INTRAVENOUS | Status: DC
  Filled 2023-06-07: qty 1

## 2023-06-07 NOTE — ED Provider Notes (Signed)
Patient was bedded from triage, initial screening labs were ordered and then patient eloped from her room prior to any medical evaluation   Glyn Ade, MD 06/07/23 1749

## 2023-06-07 NOTE — ED Notes (Signed)
Pt stated that she just wanted a ultrasound to rule out a ectopic pregnancy.  Instructed pt to get a urine sample in the bathroom, but pt was no where to found in the hall bathrooms.

## 2023-06-07 NOTE — ED Triage Notes (Signed)
Pt complaining of right sided rib/flank pain that has been present for apprx 2 months. Pt has hx of pancreatitis concerned that it could be a flare up. Pt also worried that she may be withdrawing, she did have a drink today and says that it caused the pain to become worse.

## 2023-06-17 ENCOUNTER — Emergency Department (HOSPITAL_COMMUNITY)
Admission: EM | Admit: 2023-06-17 | Discharge: 2023-06-17 | Discharge status: Against Medical Advice | Payer: No Typology Code available for payment source | Attending: Emergency Medicine | Admitting: Emergency Medicine

## 2023-06-17 ENCOUNTER — Encounter (HOSPITAL_COMMUNITY): Payer: Self-pay

## 2023-06-17 DIAGNOSIS — R1110 Vomiting, unspecified: Secondary | ICD-10-CM | POA: Insufficient documentation

## 2023-06-17 DIAGNOSIS — R1011 Right upper quadrant pain: Secondary | ICD-10-CM | POA: Diagnosis not present

## 2023-06-17 DIAGNOSIS — Z5321 Procedure and treatment not carried out due to patient leaving prior to being seen by health care provider: Secondary | ICD-10-CM | POA: Insufficient documentation

## 2023-06-17 DIAGNOSIS — R1013 Epigastric pain: Secondary | ICD-10-CM | POA: Insufficient documentation

## 2023-06-17 LAB — CBC WITH DIFFERENTIAL/PLATELET
Abs Immature Granulocytes: 0 10*3/uL (ref 0.00–0.07)
Basophils Absolute: 0 10*3/uL (ref 0.0–0.1)
Basophils Relative: 0 %
Eosinophils Absolute: 0 10*3/uL (ref 0.0–0.5)
Eosinophils Relative: 1 %
HCT: 38.8 % (ref 36.0–46.0)
Hemoglobin: 12 g/dL (ref 12.0–15.0)
Immature Granulocytes: 0 %
Lymphocytes Relative: 59 %
Lymphs Abs: 2.9 10*3/uL (ref 0.7–4.0)
MCH: 29.1 pg (ref 26.0–34.0)
MCHC: 30.9 g/dL (ref 30.0–36.0)
MCV: 93.9 fL (ref 80.0–100.0)
Monocytes Absolute: 0.4 10*3/uL (ref 0.1–1.0)
Monocytes Relative: 9 %
Neutro Abs: 1.5 10*3/uL — ABNORMAL LOW (ref 1.7–7.7)
Neutrophils Relative %: 31 %
Platelets: 259 10*3/uL (ref 150–400)
RBC: 4.13 MIL/uL (ref 3.87–5.11)
RDW: 18.1 % — ABNORMAL HIGH (ref 11.5–15.5)
WBC: 4.8 10*3/uL (ref 4.0–10.5)
nRBC: 0 % (ref 0.0–0.2)

## 2023-06-17 LAB — COMPREHENSIVE METABOLIC PANEL
ALT: 89 U/L — ABNORMAL HIGH (ref 0–44)
AST: 145 U/L — ABNORMAL HIGH (ref 15–41)
Albumin: 4 g/dL (ref 3.5–5.0)
Alkaline Phosphatase: 67 U/L (ref 38–126)
Anion gap: 12 (ref 5–15)
BUN: 8 mg/dL (ref 6–20)
CO2: 26 mmol/L (ref 22–32)
Calcium: 8.8 mg/dL — ABNORMAL LOW (ref 8.9–10.3)
Chloride: 103 mmol/L (ref 98–111)
Creatinine, Ser: 0.67 mg/dL (ref 0.44–1.00)
GFR, Estimated: >60 mL/min (ref >60)
Glucose, Bld: 108 mg/dL — ABNORMAL HIGH (ref 70–99)
Potassium: 3.5 mmol/L (ref 3.5–5.1)
Sodium: 141 mmol/L (ref 135–145)
Total Bilirubin: 0.6 mg/dL (ref 0.0–1.2)
Total Protein: 7.9 g/dL (ref 6.5–8.1)

## 2023-06-17 LAB — LIPASE, BLOOD: Lipase: 39 U/L (ref 11–51)

## 2023-06-17 MED ORDER — ONDANSETRON 4 MG PO TBDP
4.0000 mg | ORAL_TABLET | Freq: Once | ORAL | Status: AC
  Administered 2023-06-17: 4 mg via ORAL
  Filled 2023-06-17: qty 1

## 2023-06-17 NOTE — ED Triage Notes (Signed)
 Pt presents with c/o vomiting. Pt reports a hx of pancreatitis, reports that her last drink was earlier today.

## 2023-06-17 NOTE — ED Provider Triage Note (Signed)
 Emergency Medicine Provider Triage Evaluation Note  Diana Espinoza , a 43 y.o. female  was evaluated in triage.  Pt complains of epigastric pain for the past week.  Patient is unsure what may have caused this.  Patient has a history of pancreatitis but is unsure if this is the same.  Patient does not have her gallbladder anymore and states that the pain radiates from her epigastric region to her right upper quadrant.  Patient denies fevers, chest pain, shortness of breath.  Patient's not been to keep anything down over the past week..  Review of Systems  Positive:  Negative:   Physical Exam  BP 115/82 (BP Location: Left Arm)   Pulse 88   Temp 98.2 F (36.8 C) (Oral)   Resp 16   SpO2 99%  Gen:   Awake, no distress   Resp:  Normal effort  MSK:   Moves extremities without difficulty  Other:  Epigastric tenderness, no peritoneal signs  Medical Decision Making  Medically screening exam initiated at 3:25 PM.  Appropriate orders placed.  Diana Espinoza was informed that the remainder of the evaluation will be completed by another provider, this initial triage assessment does not replace that evaluation, and the importance of remaining in the ED until their evaluation is complete.  Workup initiated, patient stable at this time   Diana Espinoza 06/17/23 1526

## 2023-06-27 ENCOUNTER — Telehealth (HOSPITAL_COMMUNITY): Payer: Self-pay | Admitting: Mental Health

## 2023-06-27 NOTE — Telephone Encounter (Signed)
Therapist contacted pt upon realization no scheduled follow up noted in chart. No answer; left message to inform to contact front desk to reschedule.
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
## 2023-07-23 ENCOUNTER — Telehealth: Payer: No Typology Code available for payment source | Admitting: Physician Assistant

## 2023-07-23 DIAGNOSIS — F4310 Post-traumatic stress disorder, unspecified: Secondary | ICD-10-CM

## 2023-07-23 DIAGNOSIS — F411 Generalized anxiety disorder: Secondary | ICD-10-CM

## 2023-07-23 DIAGNOSIS — F322 Major depressive disorder, single episode, severe without psychotic features: Secondary | ICD-10-CM

## 2023-07-23 MED ORDER — ESCITALOPRAM OXALATE 10 MG PO TABS: ORAL_TABLET | ORAL | 0 refills | Status: AC

## 2023-07-23 MED ORDER — HYDROXYZINE PAMOATE 50 MG PO CAPS: 50.0000 mg | ORAL_CAPSULE | Freq: Three times a day (TID) | ORAL | 2 refills | Status: AC | PRN

## 2023-07-23 NOTE — Progress Notes (Signed)
Virtual Visit Consent   Diana Espinoza, you are scheduled for a virtual visit with a [facility] provider today. Just as with appointments in the office, your consent must be obtained to participate. Your consent will be active for this visit and any virtual visit you may have with one of our providers in the next 365 days. If you have a MyChart account, a copy of this consent can be sent to you electronically.  As this is a virtual visit, video technology does not allow for your provider to perform a traditional examination. This may limit your provider's ability to fully assess your condition. If your provider identifies any concerns that need to be evaluated in person or the need to arrange testing (such as labs, EKG, etc.), we will make arrangements to do so. Although advances in technology are sophisticated, we cannot ensure that it will always work on either your end or our end. If the connection with a video visit is poor, the visit may have to be switched to a telephone visit. With either a video or telephone visit, we are not always able to ensure that we have a secure connection.  By engaging in this virtual visit, you consent to the provision of healthcare and authorize for your insurance to be billed (if applicable) for the services provided during this visit. Depending on your insurance coverage, you may receive a charge related to this service.  I need to obtain your verbal consent now. Are you willing to proceed with your visit today? Diana Espinoza has provided verbal consent on 07/23/2023 for a virtual visit (video or telephone). Margaretann Loveless, PA-C  Date: 07/23/2023 6:27 PM   Virtual Visit via Video Note   I, Margaretann Loveless, connected with  Diana Espinoza  ([id number], [birth year]) on 07/23/23 at  5:15 PM EST by a video-enabled telemedicine application and verified that I am speaking with the correct person using two identifiers.  Location: Patient: Virtual Visit  Location Patient: Home Provider: Virtual Visit Location Provider: Home Office   I discussed the limitations of evaluation and management by telemedicine and the availability of in person appointments. The patient expressed understanding and agreed to proceed.    History of Present Illness: Diana Espinoza is a 43 y.o. who identifies as a female who was assigned female at birth, and is being seen today for worsening anxiety. Reports she has previously been on Lexapro, but was only given a very small supply so she has not continued, but the last time she talked with her [employer] Provider, they had mentioned for her to go back on the medication. In the meantime she has been Hydroxyzine 25mg  TID prn for anxiety and panic attacks. She is requesting to increase the Hydroxyzine for better relief to help when the panic attacks set in. She is also interested in restarting her Lexapro.   Her symptoms continue to include excessive worry, hyperventilation, increased panic, insomnia, nervousness/anxiousness, palpitations, and restlessness.   She has been having some legal issues, including a custody battle for her child.  Denies SI/HI.   Problems:  Patient Active Problem List   Diagnosis Date Noted   Severe major depression, single episode (HCC) 04/10/2022   PTSD (post-traumatic stress disorder) 04/10/2022   Alcohol use disorder, mild, in early remission 04/10/2022   Advanced maternal age in multigravida, third trimester 09/14/2021   MDD (major depressive disorder), recurrent episode (HCC) 12/07/2018   Pancreatitis     Allergies:  Allergies  Allergen Reactions   Avocado Other (See Comments)    Pancreatitis   Medications:  Current Outpatient Medications:    escitalopram (LEXAPRO) 10 MG tablet, Start 0.5 tablet (5mg ) PO q hs x 1 week; then increase 1 tablet (10mg ) PO q hs, Disp: 90 tablet, Rfl: 0   hydrOXYzine (VISTARIL) 50 MG capsule, Take 1 capsule (50 mg total) by mouth 3 (three) times  daily as needed., Disp: 90 capsule, Rfl: 2   chlordiazePOXIDE (LIBRIUM) 25 MG capsule, 50mg  PO TID x 1D, then 25-50mg  PO BID X 1D, then 25-50mg  PO QD X 1D (Patient taking differently: Take 50 mg by mouth as directed. 50mg  PO TID x 1D, then 25-50mg  PO BID X 1D, then 25-50mg  PO QD X 1D), Disp: 10 capsule, Rfl: 0   omeprazole (PRILOSEC) 20 MG capsule, Take 1 capsule (20 mg total) by mouth daily., Disp: 90 capsule, Rfl: 1   ondansetron (ZOFRAN-ODT) 8 MG disintegrating tablet, 8mg  ODT q4 hours prn nausea, Disp: 4 tablet, Rfl: 0   valACYclovir (VALTREX) 1000 MG tablet, Take 1 tablet (1,000 mg total) by mouth 3 (three) times daily., Disp: 21 tablet, Rfl: 0  Observations/Objective: Patient is well-developed, well-nourished in no acute distress.  Resting comfortably at home.  Head is normocephalic, atraumatic.  No labored breathing.  Speech is clear and coherent with logical content.  Patient is alert and oriented at baseline.    Assessment and Plan: 1. GAD (generalized anxiety disorder) (Primary) - hydrOXYzine (VISTARIL) 50 MG capsule; Take 1 capsule (50 mg total) by mouth 3 (three) times daily as needed.  Dispense: 90 capsule; Refill: 2 - escitalopram (LEXAPRO) 10 MG tablet; Start 0.5 tablet (5mg ) PO q hs x 1 week; then increase 1 tablet (10mg ) PO q hs  Dispense: 90 tablet; Refill: 0  2. Severe major depression, single episode (HCC) - hydrOXYzine (VISTARIL) 50 MG capsule; Take 1 capsule (50 mg total) by mouth 3 (three) times daily as needed.  Dispense: 90 capsule; Refill: 2 - escitalopram (LEXAPRO) 10 MG tablet; Start 0.5 tablet (5mg ) PO q hs x 1 week; then increase 1 tablet (10mg ) PO q hs  Dispense: 90 tablet; Refill: 0  3. PTSD (post-traumatic stress disorder) - hydrOXYzine (VISTARIL) 50 MG capsule; Take 1 capsule (50 mg total) by mouth 3 (three) times daily as needed.  Dispense: 90 capsule; Refill: 2 - escitalopram (LEXAPRO) 10 MG tablet; Start 0.5 tablet (5mg ) PO q hs x 1 week; then increase 1  tablet (10mg ) PO q hs  Dispense: 90 tablet; Refill: 0  - Add Lexapro 10mg  at bedtime - Increase Hydroxyzine to 50mg  from 25mg ; Drowsiness precautions discussed - Call and follow up with Therapist/Counselor/Psychiatrist - Seek in person evaluation if symptoms worsen or fail to improve  Follow Up Instructions: I discussed the assessment and treatment plan with the patient. The patient was provided an opportunity to ask questions and all were answered. The patient agreed with the plan and demonstrated an understanding of the instructions.  A copy of instructions were sent to the patient via MyChart unless otherwise noted below.    The patient was advised to call back or seek an in-person evaluation if the symptoms worsen or if the condition fails to improve as anticipated.    Margaretann Loveless, PA-C

## 2023-07-23 NOTE — Patient Instructions (Signed)
[name], thank you for joining Margaretann Loveless, PA-C for today's virtual visit.  While this provider is not your primary care provider (PCP), if your PCP is located in our provider database this encounter information will be shared with them immediately following your visit.   A [facility] MyChart account gives you access to today's visit and all your visits, tests, and labs performed at [facility] " click here if you don't have a [facility] MyChart account or go to [email or web address]  Consent: (Patient) TRISTIN GLADMAN provided verbal consent for this virtual visit at the beginning of the encounter.  Current Medications:  Current Outpatient Medications:    escitalopram (LEXAPRO) 10 MG tablet, Start 0.5 tablet (5mg ) PO q hs x 1 week; then increase 1 tablet (10mg ) PO q hs, Disp: 90 tablet, Rfl: 0   hydrOXYzine (VISTARIL) 50 MG capsule, Take 1 capsule (50 mg total) by mouth 3 (three) times daily as needed., Disp: 90 capsule, Rfl: 2   chlordiazePOXIDE (LIBRIUM) 25 MG capsule, 50mg  PO TID x 1D, then 25-50mg  PO BID X 1D, then 25-50mg  PO QD X 1D (Patient taking differently: Take 50 mg by mouth as directed. 50mg  PO TID x 1D, then 25-50mg  PO BID X 1D, then 25-50mg  PO QD X 1D), Disp: 10 capsule, Rfl: 0   omeprazole (PRILOSEC) 20 MG capsule, Take 1 capsule (20 mg total) by mouth daily., Disp: 90 capsule, Rfl: 1   ondansetron (ZOFRAN-ODT) 8 MG disintegrating tablet, 8mg  ODT q4 hours prn nausea, Disp: 4 tablet, Rfl: 0   valACYclovir (VALTREX) 1000 MG tablet, Take 1 tablet (1,000 mg total) by mouth 3 (three) times daily., Disp: 21 tablet, Rfl: 0   Medications ordered in this encounter:  Meds ordered this encounter  Medications   hydrOXYzine (VISTARIL) 50 MG capsule    Sig: Take 1 capsule (50 mg total) by mouth 3 (three) times daily as needed.    Dispense:  90 capsule    Refill:  2    Supervising Provider:   Merrilee Jansky [[id number]]   escitalopram (LEXAPRO) 10 MG  tablet    Sig: Start 0.5 tablet (5mg ) PO q hs x 1 week; then increase 1 tablet (10mg ) PO q hs    Dispense:  90 tablet    Refill:  0    Supervising Provider:   Merrilee Jansky [[id number]]     *If you need refills on other medications prior to your next appointment, please contact your pharmacy*  Follow-Up: Call back or seek an in-person evaluation if the symptoms worsen or if the condition fails to improve as anticipated.  [facility] [phone]  Other Instructions  Managing Anxiety, Adult After being diagnosed with anxiety, you may be relieved to know why you have felt or behaved a certain way. You may also feel overwhelmed about the treatment ahead and what it will mean for your life. With care and support, you can manage your anxiety. How to manage lifestyle changes Understanding the difference between stress and anxiety Although stress can play a role in anxiety, it is not the same as anxiety. Stress is your body's reaction to life changes and events, both good and bad. Stress is often caused by something external, such as a deadline, test, or competition. It normally goes away after the event has ended and will last just a few hours. But, stress can be ongoing and can lead to more than just stress. Anxiety is caused by something internal, such  as imagining a terrible outcome or worrying that something will go wrong that will greatly upset you. Anxiety often does not go away even after the event is over, and it can become a long-term (chronic) worry. Lowering stress and anxiety Talk with your health care provider or a counselor to learn more about lowering anxiety and stress. They may suggest tension-reduction techniques, such as: Music. Spend time creating or listening to music that you enjoy and that inspires you. Mindfulness-based meditation. Practice being aware of your normal breaths while not trying to control your breathing. It can be done while sitting or  walking. Centering prayer. Focus on a word, phrase, or sacred image that means something to you and brings you peace. Deep breathing. Expand your stomach and inhale slowly through your nose. Hold your breath for 3-5 seconds. Then breathe out slowly, letting your stomach muscles relax. Self-talk. Learn to notice and spot thought patterns that lead to anxiety reactions. Change those patterns to thoughts that feel peaceful. Muscle relaxation. Take time to tense muscles and then relax them. Choose a tension-reduction technique that fits your lifestyle and personality. These techniques take time and practice. Set aside 5-15 minutes a day to do them. Specialized therapists can offer counseling and training in these techniques. The training to help with anxiety may be covered by some insurance plans. Other things you can do to manage stress and anxiety include: Keeping a stress diary. This can help you learn what triggers your reaction and then learn ways to manage your response. Thinking about how you react to certain situations. You may not be able to control everything, but you can control your response. Making time for activities that help you relax and not feeling guilty about spending your time in this way. Doing visual imagery. This involves imagining or creating mental pictures to help you relax. Practicing yoga. Through yoga poses, you can lower tension and relax.  Medicines Medicines for anxiety include: Antidepressant medicines. These are usually prescribed for long-term daily control. Anti-anxiety medicines. These may be added in severe cases, especially when panic attacks occur. When used together, medicines, psychotherapy, and tension-reduction techniques may be the most effective treatment. Relationships Relationships can play a big part in helping you recover. Spend more time connecting with trusted friends and family members. Think about going to couples counseling if you have a partner,  taking family education classes, or going to family therapy. Therapy can help you and others better understand your anxiety. How to recognize changes in your anxiety Everyone responds differently to treatment for anxiety. Recovery from anxiety happens when symptoms lessen and stop interfering with your daily life at home or work. This may mean that you will start to: Have better concentration and focus. Worry will interfere less in your daily thinking. Sleep better. Be less irritable. Have more energy. Have improved memory. Try to recognize when your condition is getting worse. Contact your provider if your symptoms interfere with home or work and you feel like your condition is not improving. Follow these instructions at home: Activity Exercise. Adults should: Exercise for at least 150 minutes each week. The exercise should increase your heart rate and make you sweat (moderate-intensity exercise). Do strengthening exercises at least twice a week. Get the right amount and quality of sleep. Most adults need 7-9 hours of sleep each night. Lifestyle  Eat a healthy diet that includes plenty of vegetables, fruits, whole grains, low-fat dairy products, and lean protein. Do not eat a lot of foods that  are high in fats, added sugars, or salt (sodium). Make choices that simplify your life. Do not use any products that contain nicotine or tobacco. These products include cigarettes, chewing tobacco, and vaping devices, such as e-cigarettes. If you need help quitting, ask your provider. Avoid caffeine, alcohol, and certain over-the-counter cold medicines. These may make you feel worse. Ask your pharmacist which medicines to avoid. General instructions Take over-the-counter and prescription medicines only as told by your provider. Keep all follow-up visits. This is to make sure you are managing your anxiety well or if you need more support. Where to find support You can get help and support  from: Self-help groups. Online and [employer]. A trusted spiritual leader. Couples counseling. Family education classes. Family therapy. Where to find more information You may find that joining a support group helps you deal with your anxiety. The following sources can help you find counselors or support groups near you: Mental Health America: mentalhealthamerica.net Anxiety and Depression Association of [place] (ADAA): adaa.org The [employer] on Mental Illness (NAMI): nami.org Contact a health care provider if: You have a hard time staying focused or finishing tasks. You spend many hours a day feeling worried about everyday life. You are very tired because you cannot stop worrying. You start to have headaches or often feel tense. You have chronic nausea or diarrhea. Get help right away if: Your heart feels like it is racing. You have shortness of breath. You have thoughts of hurting yourself or others. Get help right away if you feel like you may hurt yourself or others, or have thoughts about taking your own life. Go to your nearest emergency room or: Call 911. Call the National Suicide Prevention Lifeline at 3807774879 or 988. This is open 24 hours a day. Text the Crisis Text Line at 707 858 5097. This information is not intended to replace advice given to you by your health care provider. Make sure you discuss any questions you have with your health care provider. Document Revised: 03/05/2022 Document Reviewed: 09/17/2020 Elsevier Patient Education  2024 Elsevier Inc.   If you have been instructed to have an in-person evaluation today at a local Urgent Care facility, please use the link below. It will take you to a list of all of our available [facility], including address, phone number and hours of operation. Please do not delay care.  [facility]  If you or a family member do not have a primary care provider, use the link below to  schedule a visit and establish care. When you choose a [facility] primary care physician or advanced practice provider, you gain a long-term partner in health. Find a Primary Care Provider  Learn more about [facility] in-office and virtual care options: [facility] - Get Care Now

## 2023-11-06 ENCOUNTER — Telehealth: Admitting: Physician Assistant

## 2023-11-06 DIAGNOSIS — R3989 Other symptoms and signs involving the genitourinary system: Secondary | ICD-10-CM

## 2023-11-06 MED ORDER — NITROFURANTOIN MONOHYD MACRO 100 MG PO CAPS: 100.0000 mg | ORAL_CAPSULE | Freq: Two times a day (BID) | ORAL | 0 refills | Status: AC

## 2023-11-06 NOTE — Progress Notes (Signed)
 Virtual Visit Consent   [name], you are scheduled for a virtual visit with a [facility] provider today. Just as with appointments in the office, your consent must be obtained to participate. Your consent will be active for this visit and any virtual visit you may have with one of our providers in the next 365 days. If you have a MyChart account, a copy of this consent can be sent to you electronically.  As this is a virtual visit, video technology does not allow for your provider to perform a traditional examination. This may limit your provider's ability to fully assess your condition. If your provider identifies any concerns that need to be evaluated in person or the need to arrange testing (such as labs, EKG, etc.), we will make arrangements to do so. Although advances in technology are sophisticated, we cannot ensure that it will always work on either your end or our end. If the connection with a video visit is poor, the visit may have to be switched to a telephone visit. With either a video or telephone visit, we are not always able to ensure that we have a secure connection.  By engaging in this virtual visit, you consent to the provision of healthcare and authorize for your insurance to be billed (if applicable) for the services provided during this visit. Depending on your insurance coverage, you may receive a charge related to this service.  I need to obtain your verbal consent now. Are you willing to proceed with your visit today? Alexsa KAROLYNA BIANCHINI has provided verbal consent on 11/06/2023 for a virtual visit (video or telephone). Wanita Gutta, PA-C  Date: 11/06/2023 5:28 PM   Virtual Visit via Video Note   I, Diana Espinoza, connected with  Diana Espinoza  ([id number], 44-Jun-1982) on 11/06/23 at  5:00 PM EDT by a video-enabled telemedicine application and verified that I am speaking with the correct person using two identifiers.  Location: Patient: Virtual Visit Location Patient:  Home Provider: Virtual Visit Location Provider: Home Office   I discussed the limitations of evaluation and management by telemedicine and the availability of in person appointments. The patient expressed understanding and agreed to proceed.    History of Present Illness: Diana Espinoza is a 43 y.o. who identifies as a female who was assigned female at birth, and is being seen today for urinary symptoms.  HPI: 43y/o F presents to video telehealth visit for c/o urinary frequency and dysuria x 1-2 days. Denies fever, vaginal d/c, or back pain. +h/o multiple UTIs and feels similar symptoms.    Urinary Tract Infection     Problems:  Patient Active Problem List   Diagnosis Date Noted   Severe major depression, single episode (HCC) 04/10/2022   PTSD (post-traumatic stress disorder) 04/10/2022   Alcohol use disorder, mild, in early remission 04/10/2022   43d maternal age in multigravida, third trimester 09/14/2021   MDD (major depressive disorder), recurrent episode (HCC) 12/07/2018   Pancreatitis     Allergies:  Allergies  Allergen Reactions   Avocado Other (See Comments)    Pancreatitis   Medications:  Current Outpatient Medications:    nitrofurantoin, macrocrystal-monohydrate, (MACROBID) 100 MG capsule, Take 1 capsule (100 mg total) by mouth 2 (two) times daily for 5 days., Disp: 10 capsule, Rfl: 0   chlordiazePOXIDE  (LIBRIUM ) 25 MG capsule, 50mg  PO TID x 1D, then 25-50mg  PO BID X 1D, then 25-50mg  PO QD X 1D (Patient taking differently: Take 50 mg by mouth as  directed. 50mg  PO TID x 1D, then 25-50mg  PO BID X 1D, then 25-50mg  PO QD X 1D), Disp: 10 capsule, Rfl: 0   escitalopram  (LEXAPRO ) 10 MG tablet, Start 0.5 tablet (5mg ) PO q hs x 1 week; then increase 1 tablet (10mg ) PO q hs, Disp: 90 tablet, Rfl: 0   hydrOXYzine  (VISTARIL ) 50 MG capsule, Take 1 capsule (50 mg total) by mouth 3 (three) times daily as needed., Disp: 90 capsule, Rfl: 2   omeprazole  (PRILOSEC) 20 MG capsule,  Take 1 capsule (20 mg total) by mouth daily., Disp: 90 capsule, Rfl: 1   ondansetron  (ZOFRAN -ODT) 8 MG disintegrating tablet, 8mg  ODT q4 hours prn nausea, Disp: 4 tablet, Rfl: 0   valACYclovir  (VALTREX ) 1000 MG tablet, Take 1 tablet (1,000 mg total) by mouth 3 (three) times daily., Disp: 21 tablet, Rfl: 0  Observations/Objective: Patient is well-developed, well-nourished in no acute distress.  Resting comfortably  at home.  Head is normocephalic, atraumatic.  No labored breathing.  Speech is clear and coherent with logical content.  Patient is alert and oriented at baseline.    Assessment and Plan: 1. Suspected UTI (Primary) - nitrofurantoin, macrocrystal-monohydrate, (MACROBID) 100 MG capsule; Take 1 capsule (100 mg total) by mouth 2 (two) times daily for 5 days.  Dispense: 10 capsule; Refill: 0  Increase fluids Drink cranberry juice. Start medicine as prescribed. If symptoms don't improve in 2-3 days then follow up at [facility] for further evaluation and management. Pt verbalized understanding and in agreement.    Follow Up Instructions: I discussed the assessment and treatment plan with the patient. The patient was provided an opportunity to ask questions and all were answered. The patient agreed with the plan and demonstrated an understanding of the instructions.  A copy of instructions were sent to the patient via MyChart unless otherwise noted below.   Patient has requested to receive PHI (AVS, Work Notes, etc) pertaining to this video visit through e-mail as they are currently without active MyChart. They have voiced understand that email is not considered secure and their health information could be viewed by someone other than the patient.   The patient was advised to call back or seek an in-person evaluation if the symptoms worsen or if the condition fails to improve as anticipated.    Leira Regino, PA-C

## 2023-11-06 NOTE — Patient Instructions (Signed)
  Diana Espinoza, thank you for joining Wanita Gutta, PA-C for today's virtual visit.  While this provider is not your primary care provider (PCP), if your PCP is located in our provider database this encounter information will be shared with them immediately following your visit.   A [facility] MyChart account gives you access to today's visit and all your visits, tests, and labs performed at [facility] " click here if you don't have a [name] account or go to [email or web address]  Consent: (Patient) Diana Espinoza provided verbal consent for this virtual visit at the beginning of the encounter.  Current Medications:  Current Outpatient Medications:    nitrofurantoin, macrocrystal-monohydrate, (MACROBID) 100 MG capsule, Take 1 capsule (100 mg total) by mouth 2 (two) times daily for 5 days., Disp: 10 capsule, Rfl: 0   chlordiazePOXIDE  (LIBRIUM ) 25 MG capsule, 50mg  PO TID x 1D, then 25-50mg  PO BID X 1D, then 25-50mg  PO QD X 1D (Patient taking differently: Take 50 mg by mouth as directed. 50mg  PO TID x 1D, then 25-50mg  PO BID X 1D, then 25-50mg  PO QD X 1D), Disp: 10 capsule, Rfl: 0   escitalopram  (LEXAPRO ) 10 MG tablet, Start 0.5 tablet (5mg ) PO q hs x 1 week; then increase 1 tablet (10mg ) PO q hs, Disp: 90 tablet, Rfl: 0   hydrOXYzine  (VISTARIL ) 50 MG capsule, Take 1 capsule (50 mg total) by mouth 3 (three) times daily as needed., Disp: 90 capsule, Rfl: 2   omeprazole  (PRILOSEC) 20 MG capsule, Take 1 capsule (20 mg total) by mouth daily., Disp: 90 capsule, Rfl: 1   ondansetron  (ZOFRAN -ODT) 8 MG disintegrating tablet, 8mg  ODT q4 hours prn nausea, Disp: 4 tablet, Rfl: 0   valACYclovir  (VALTREX ) 1000 MG tablet, Take 1 tablet (1,000 mg total) by mouth 3 (three) times daily., Disp: 21 tablet, Rfl: 0   Medications ordered in this encounter:  Meds ordered this encounter  Medications   nitrofurantoin, macrocrystal-monohydrate, (MACROBID) 100 MG capsule    Sig: Take 1  capsule (100 mg total) by mouth 2 (two) times daily for 5 days.    Dispense:  10 capsule    Refill:  0    Supervising Provider:   Corine Dice [[id number]]     *If you need refills on other medications prior to your next appointment, please contact your pharmacy*  Follow-Up: Call back or seek an in-person evaluation if the symptoms worsen or if the condition fails to improve as anticipated.  [facility] [phone]  Other Instructions Increase fluids Drink cranberry juice. Start medicine as prescribed. If symptoms don't improve in 2-3 days then follow up at [facility] clinic for further evaluation and management.   If you have been instructed to have an in-person evaluation today at a local Urgent Care facility, please use the link below. It will take you to a list of all of our available [facility], including address, phone number and hours of operation. Please do not delay care.  [facility]  If you or a family member do not have a primary care provider, use the link below to schedule a visit and establish care. When you choose a [facility] primary care physician or advanced practice provider, you gain a long-term partner in health. Find a Primary Care Provider  Learn more about [facility]'s in-office and virtual care options: [facility] Get Care Now

## 2023-11-14 ENCOUNTER — Telehealth

## 2023-11-23 ENCOUNTER — Telehealth: Admitting: Family Medicine

## 2023-11-23 DIAGNOSIS — F411 Generalized anxiety disorder: Secondary | ICD-10-CM

## 2023-11-23 MED ORDER — ESCITALOPRAM OXALATE 10 MG PO TABS: 10.0000 mg | ORAL_TABLET | Freq: Every day | ORAL | 0 refills | Status: AC

## 2023-11-23 NOTE — Progress Notes (Signed)
 Virtual Visit Consent   Diana Espinoza, you are scheduled for a virtual visit with a [facility] provider today. Just as with appointments in the office, your consent must be obtained to participate. Your consent will be active for this visit and any virtual visit you may have with one of our providers in the next 365 days. If you have a MyChart account, a copy of this consent can be sent to you electronically.  As this is a virtual visit, video technology does not allow for your provider to perform a traditional examination. This may limit your provider's ability to fully assess your condition. If your provider identifies any concerns that need to be evaluated in person or the need to arrange testing (such as labs, EKG, etc.), we will make arrangements to do so. Although advances in technology are sophisticated, we cannot ensure that it will always work on either your end or our end. If the connection with a video visit is poor, the visit may have to be switched to a telephone visit. With either a video or telephone visit, we are not always able to ensure that we have a secure connection.  By engaging in this virtual visit, you consent to the provision of healthcare and authorize for your insurance to be billed (if applicable) for the services provided during this visit. Depending on your insurance coverage, you may receive a charge related to this service.  I need to obtain your verbal consent now. Are you willing to proceed with your visit today? Diana Espinoza has provided verbal consent on 11/23/2023 for a virtual visit (video or telephone). Diana Huger, FNP  Date: 11/23/2023 12:50 PM   Virtual Visit via Video Note   I, Diana Espinoza, connected with  Diana Espinoza  ([id number], [birth year]) on 11/23/23 at 12:45 PM EDT by a video-enabled telemedicine application and verified that I am speaking with the correct person using two identifiers.  Location: Patient: Virtual Visit Location Patient:  Home Provider: Virtual Visit Location Provider: Home Office   I discussed the limitations of evaluation and management by telemedicine and the availability of in person appointments. The patient expressed understanding and agreed to proceed.    History of Present Illness: Diana Espinoza is a 43 y.o. who identifies as a female who was assigned female at birth, and is being seen today for a refill on lexapro . She has hydroxyzine  to use. She has not been to a PCP. She reports this works well for her. Aaron Aas  HPI: HPI  Problems:  Patient Active Problem List   Diagnosis Date Noted   Severe major depression, single episode (HCC) 04/10/2022   PTSD (post-traumatic stress disorder) 04/10/2022   Alcohol use disorder, mild, in early remission 04/10/2022   Advanced maternal age in multigravida, third trimester 09/14/2021   MDD (major depressive disorder), recurrent episode (HCC) 12/07/2018   Pancreatitis     Allergies:  Allergies  Allergen Reactions   Avocado Other (See Comments)    Pancreatitis   Medications:  Current Outpatient Medications:    escitalopram  (LEXAPRO ) 10 MG tablet, Take 1 tablet (10 mg total) by mouth daily for 10 days., Disp: 10 tablet, Rfl: 0   chlordiazePOXIDE  (LIBRIUM ) 25 MG capsule, 50mg  PO TID x 1D, then 25-50mg  PO BID X 1D, then 25-50mg  PO QD X 1D (Patient taking differently: Take 50 mg by mouth as directed. 50mg  PO TID x 1D, then 25-50mg  PO BID X 1D, then 25-50mg  PO QD X 1D), Disp: 10 capsule,  Rfl: 0   escitalopram  (LEXAPRO ) 10 MG tablet, Start 0.5 tablet (5mg ) PO q hs x 1 week; then increase 1 tablet (10mg ) PO q hs, Disp: 90 tablet, Rfl: 0   hydrOXYzine  (VISTARIL ) 50 MG capsule, Take 1 capsule (50 mg total) by mouth 3 (three) times daily as needed., Disp: 90 capsule, Rfl: 2   omeprazole  (PRILOSEC) 20 MG capsule, Take 1 capsule (20 mg total) by mouth daily., Disp: 90 capsule, Rfl: 1   ondansetron  (ZOFRAN -ODT) 8 MG disintegrating tablet, 8mg  ODT q4 hours prn nausea, Disp: 4  tablet, Rfl: 0   valACYclovir  (VALTREX ) 1000 MG tablet, Take 1 tablet (1,000 mg total) by mouth 3 (three) times daily., Disp: 21 tablet, Rfl: 0  Observations/Objective: Patient is well-developed, well-nourished in no acute distress.  Resting comfortably  at home.  Head is normocephalic, atraumatic.  No labored breathing.  Speech is clear and coherent with logical content.  Patient is alert and oriented at baseline.    Assessment and Plan: 1. GAD (generalized anxiety disorder) (Primary)  Schedule to follow up with a pcp for longer refills and monitoring.  Follow Up Instructions: I discussed the assessment and treatment plan with the patient. The patient was provided an opportunity to ask questions and all were answered. The patient agreed with the plan and demonstrated an understanding of the instructions.  A copy of instructions were sent to the patient via MyChart unless otherwise noted below.     The patient was advised to call back or seek an in-person evaluation if the symptoms worsen or if the condition fails to improve as anticipated.    [name], FNP

## 2023-11-23 NOTE — Patient Instructions (Signed)

## 2024-01-26 ENCOUNTER — Ambulatory Visit (HOSPITAL_COMMUNITY): Admitting: Mental Health

## 2024-01-30 ENCOUNTER — Ambulatory Visit (HOSPITAL_COMMUNITY): Admitting: Mental Health

## 2024-02-20 ENCOUNTER — Encounter (HOSPITAL_COMMUNITY): Payer: Self-pay

## 2024-02-20 ENCOUNTER — Ambulatory Visit (HOSPITAL_COMMUNITY): Payer: MEDICAID | Admitting: Mental Health

## 2024-05-17 ENCOUNTER — Telehealth: Payer: MEDICAID | Admitting: Family Medicine

## 2024-05-17 DIAGNOSIS — N760 Acute vaginitis: Secondary | ICD-10-CM | POA: Diagnosis not present

## 2024-05-17 DIAGNOSIS — B9689 Other specified bacterial agents as the cause of diseases classified elsewhere: Secondary | ICD-10-CM

## 2024-05-17 MED ORDER — METRONIDAZOLE 500 MG PO TABS: 500.0000 mg | ORAL_TABLET | Freq: Two times a day (BID) | ORAL | 0 refills | Status: AC

## 2024-05-17 NOTE — Progress Notes (Signed)
 Virtual Visit Consent   Diana Espinoza, you are scheduled for a virtual visit with a [facility] provider today. Just as with appointments in the office, your consent must be obtained to participate. Your consent will be active for this visit and any virtual visit you may have with one of our providers in the next 365 days. If you have a MyChart account, a copy of this consent can be sent to you electronically.  As this is a virtual visit, video technology does not allow for your provider to perform a traditional examination. This may limit your provider's ability to fully assess your condition. If your provider identifies any concerns that need to be evaluated in person or the need to arrange testing (such as labs, EKG, etc.), we will make arrangements to do so. Although advances in technology are sophisticated, we cannot ensure that it will always work on either your end or our end. If the connection with a video visit is poor, the visit may have to be switched to a telephone visit. With either a video or telephone visit, we are not always able to ensure that we have a secure connection.  By engaging in this virtual visit, you consent to the provision of healthcare and authorize for your insurance to be billed (if applicable) for the services provided during this visit. Depending on your insurance coverage, you may receive a charge related to this service.  I need to obtain your verbal consent now. Are you willing to proceed with your visit today? Diana Espinoza has provided verbal consent on 05/17/2024 for a virtual visit (video or telephone). Diana CHRISTELLA Barefoot, NP  Date: 05/17/2024 11:17 AM   Virtual Visit via Video Note   I, Diana Espinoza, connected with  Diana Espinoza  ([id number], Jun 03, 1981) on 05/17/24 at 11:15 AM EST by a video-enabled telemedicine application and verified that I am speaking with the correct person using two identifiers.  Location: Patient: Virtual Visit Location Patient:  Home Provider: Virtual Visit Location Provider: Home Office   I discussed the limitations of evaluation and management by telemedicine and the availability of in person appointments. The patient expressed understanding and agreed to proceed.    History of Present Illness: Diana Espinoza is a 43 y.o. who identifies as a female who was assigned female at birth, and is being seen today for BV  Onset was 4 days ago with odor and watery grey discharge Associated symptoms are none Modifying factors are nothing Denies chest pain, shortness of breath, fevers, chills, UTI symptoms, pelvic, flank, or back pain, changes in sexual partners- and STI exposure    Problems:  Patient Active Problem List   Diagnosis Date Noted   Severe major depression, single episode (HCC) 04/10/2022   PTSD (post-traumatic stress disorder) 04/10/2022   Alcohol use disorder, mild, in early remission 04/10/2022   Advanced maternal age in multigravida, third trimester 09/14/2021   MDD (major depressive disorder), recurrent episode 12/07/2018   Pancreatitis     Allergies:  Allergies  Allergen Reactions   Avocado Other (See Comments)    Pancreatitis   Medications:  Current Outpatient Medications:    chlordiazePOXIDE  (LIBRIUM ) 25 MG capsule, 50mg  PO TID x 1D, then 25-50mg  PO BID X 1D, then 25-50mg  PO QD X 1D (Patient taking differently: Take 50 mg by mouth as directed. 50mg  PO TID x 1D, then 25-50mg  PO BID X 1D, then 25-50mg  PO QD X 1D), Disp: 10 capsule, Rfl: 0   escitalopram  (  LEXAPRO ) 10 MG tablet, Start 0.5 tablet (5mg ) PO q hs x 1 week; then increase 1 tablet (10mg ) PO q hs, Disp: 90 tablet, Rfl: 0   escitalopram  (LEXAPRO ) 10 MG tablet, Take 1 tablet (10 mg total) by mouth daily for 10 days., Disp: 10 tablet, Rfl: 0   hydrOXYzine  (VISTARIL ) 50 MG capsule, Take 1 capsule (50 mg total) by mouth 3 (three) times daily as needed., Disp: 90 capsule, Rfl: 2   omeprazole  (PRILOSEC) 20 MG capsule, Take 1 capsule (20 mg  total) by mouth daily., Disp: 90 capsule, Rfl: 1   ondansetron  (ZOFRAN -ODT) 8 MG disintegrating tablet, 8mg  ODT q4 hours prn nausea, Disp: 4 tablet, Rfl: 0   valACYclovir  (VALTREX ) 1000 MG tablet, Take 1 tablet (1,000 mg total) by mouth 3 (three) times daily., Disp: 21 tablet, Rfl: 0  Observations/Objective: Patient is well-developed, well-nourished in no acute distress.  Resting comfortably  at home.  Head is normocephalic, atraumatic.  No labored breathing.  Speech is clear and coherent with logical content.  Patient is alert and oriented at baseline.    Assessment and Plan: 1. BV (bacterial vaginosis) (Primary)  - metroNIDAZOLE  (FLAGYL ) 500 MG tablet; Take 1 tablet (500 mg total) by mouth 2 (two) times daily for 7 days.  Dispense: 14 tablet; Refill: 0   -no other red flags for UTI, kidney, PID infection -increase fluids -complete medication as discussed -prevention discussed and on AVS   Reviewed side effects, risks and benefits of medication.    Patient acknowledged agreement and understanding of the plan.   Past Medical, Surgical, Social History, Allergies, and Medications have been Reviewed.     Follow Up Instructions: I discussed the assessment and treatment plan with the patient. The patient was provided an opportunity to ask questions and all were answered. The patient agreed with the plan and demonstrated an understanding of the instructions.  A copy of instructions were sent to the patient via MyChart unless otherwise noted below.    The patient was advised to call back or seek an in-person evaluation if the symptoms worsen or if the condition fails to improve as anticipated.    Diana CHRISTELLA Barefoot, NP

## 2024-05-17 NOTE — Patient Instructions (Addendum)
 Diana Espinoza Diana Espinoza, thank you for joining Diana Espinoza Barefoot, NP for today's virtual visit.  While this provider is not your primary care provider (PCP), if your PCP is located in our provider database this encounter information will be shared with them immediately following your visit.   A [facility] MyChart account gives you access to today's visit and all your visits, tests, and labs performed at [facility]  click here if you don't have a  MyChart account or go to [email or web address]  Consent: (Patient) Diana Espinoza provided verbal consent for this virtual visit at the beginning of the encounter.  Current Medications:  Current Outpatient Medications:    metroNIDAZOLE  (FLAGYL ) 500 MG tablet, Take 1 tablet (500 mg total) by mouth 2 (two) times daily for 7 days., Disp: 14 tablet, Rfl: 0   chlordiazePOXIDE  (LIBRIUM ) 25 MG capsule, 50mg  PO TID x 1D, then 25-50mg  PO BID X 1D, then 25-50mg  PO QD X 1D (Patient taking differently: Take 50 mg by mouth as directed. 50mg  PO TID x 1D, then 25-50mg  PO BID X 1D, then 25-50mg  PO QD X 1D), Disp: 10 capsule, Rfl: 0   escitalopram  (LEXAPRO ) 10 MG tablet, Start 0.5 tablet (5mg ) PO q hs x 1 week; then increase 1 tablet (10mg ) PO q hs, Disp: 90 tablet, Rfl: 0   escitalopram  (LEXAPRO ) 10 MG tablet, Take 1 tablet (10 mg total) by mouth daily for 10 days., Disp: 10 tablet, Rfl: 0   hydrOXYzine  (VISTARIL ) 50 MG capsule, Take 1 capsule (50 mg total) by mouth 3 (three) times daily as needed., Disp: 90 capsule, Rfl: 2   omeprazole  (PRILOSEC) 20 MG capsule, Take 1 capsule (20 mg total) by mouth daily., Disp: 90 capsule, Rfl: 1   ondansetron  (ZOFRAN -ODT) 8 MG disintegrating tablet, 8mg  ODT q4 hours prn nausea, Disp: 4 tablet, Rfl: 0   valACYclovir  (VALTREX ) 1000 MG tablet, Take 1 tablet (1,000 mg total) by mouth 3 (three) times daily., Disp: 21 tablet, Rfl: 0   Medications ordered in this encounter:  Meds ordered this encounter  Medications    metroNIDAZOLE  (FLAGYL ) 500 MG tablet    Sig: Take 1 tablet (500 mg total) by mouth 2 (two) times daily for 7 days.    Dispense:  14 tablet    Refill:  0    Supervising Provider:   BLAISE ALEENE KIDD [[id number]]     *If you need refills on other medications prior to your next appointment, please contact your pharmacy*  Follow-Up: Call back or seek an in-person evaluation if the symptoms worsen or if the condition fails to improve as anticipated.  [facility] [phone]  Other Instructions  Healthy vaginal hygiene practices   -  Avoid sleeper pajamas. Nightgowns allow air to circulate.  Sleep without underpants whenever possible.  -  Wear cotton underpants during the day. Double-rinse underwear after washing to avoid residual irritants. Do not use fabric softeners for underwear and swimsuits.  - Avoid tights, leotards, leggings, skinny jeans, and other tight-fitting clothing. Skirts and loose-fitting pants allow air to circulate.  - Avoid pantyliners.  Instead use tampons or cotton pads.  - Use the restroom after intercourse to help prevent UTI's  - Daily warm bathing is helpful:     - Soak in clean water  (no soap) for 10 to 15 minutes. Adding vinegar or baking soda to the water  has not been specifically studied and may not be better than clean water  alone.      -  Use soap to wash regions other than the genital area just before getting out of the tub. Limit use of any soap on genital areas. Use fragance-free soaps.     - Rinse the genital area well and gently pat dry.  Don't rub.  Hair dryer to assist with drying can be used only if on cool setting.     - Do not use bubble baths or perfumed soaps.  - Do not use any feminine sprays, douches or powders.  These contain chemicals that will irritate the skin.  - If the genital area is tender or swollen, cool compresses may relieve the discomfort. Unscented wet wipes can be used instead of toilet paper for wiping.   -  Emollients, such as Vaseline, may help protect skin and can be applied to the irritated area.  - Always remember to wipe front-to-back after bowel movements. Pat dry after urination.  - Do not sit in wet swimsuits for long periods of time after swimming     If you have been instructed to have an in-person evaluation today at a local Urgent Care facility, please use the link below. It will take you to a list of all of our available [facility], including address, phone number and hours of operation. Please do not delay care.  [facility]  If you or a family member do not have a primary care provider, use the link below to schedule a visit and establish care. When you choose a [facility] primary care physician or advanced practice provider, you gain a long-term partner in health. Find a Primary Care Provider  Learn more about [facility]'s in-office and virtual care options: [facility] - Get Care Now
